# Patient Record
Sex: Female | Born: 1945 | Race: White | Hispanic: No | Marital: Married | Smoking: Never smoker
Health system: Southern US, Community
[De-identification: ages and names within clinical notes are randomized; demographics above are authoritative.]

## PROBLEM LIST (undated history)

## (undated) DIAGNOSIS — I10 Essential (primary) hypertension: Secondary | ICD-10-CM

## (undated) HISTORY — PX: ESOPHAGUS SURGERY: SHX626

## (undated) HISTORY — PX: BREAST BIOPSY: SHX20

---

## 1979-10-26 HISTORY — PX: THORACIC DISC SURGERY: SHX801

## 1993-10-25 HISTORY — PX: CHOLECYSTECTOMY: SHX55

## 2000-09-27 ENCOUNTER — Emergency Department (HOSPITAL_COMMUNITY): Admission: EM | Admit: 2000-09-27 | Discharge: 2000-09-27 | Payer: Self-pay | Admitting: Emergency Medicine

## 2001-04-06 ENCOUNTER — Other Ambulatory Visit: Admission: RE | Admit: 2001-04-06 | Discharge: 2001-04-06 | Payer: Self-pay | Admitting: Obstetrics and Gynecology

## 2002-02-08 ENCOUNTER — Ambulatory Visit (HOSPITAL_COMMUNITY): Admission: RE | Admit: 2002-02-08 | Discharge: 2002-02-08 | Payer: Self-pay | Admitting: Gastroenterology

## 2010-05-08 ENCOUNTER — Encounter: Admission: RE | Admit: 2010-05-08 | Discharge: 2010-05-08 | Payer: Self-pay | Admitting: Family Medicine

## 2011-01-20 ENCOUNTER — Encounter: Payer: Self-pay | Admitting: Family Medicine

## 2011-01-20 DIAGNOSIS — E559 Vitamin D deficiency, unspecified: Secondary | ICD-10-CM | POA: Insufficient documentation

## 2011-01-20 DIAGNOSIS — K220 Achalasia of cardia: Secondary | ICD-10-CM | POA: Insufficient documentation

## 2011-01-20 DIAGNOSIS — I10 Essential (primary) hypertension: Secondary | ICD-10-CM | POA: Insufficient documentation

## 2011-03-12 NOTE — Procedures (Signed)
[facility]  Patient:    Amber Roach, Amber Roach Visit Number: [id number] MRN: [id number]          Service Type: END Location: ENDO Attending Physician:  Nelda Marseille Dictated by:   Petra Kuba, M.D. Proc. Date: 02/08/02 Admit Date:  02/08/2002   CC:         Monica Becton, M.D.  Beather Arbour Thomasena Edis, M.D., Ph.D.   Procedure Report  PROCEDURE:  Colonoscopy.  INDICATION:  Screening with her diarrhea and urgency unchanged ever since her gallbladder, history of anemia. Consent was signed after risks, benefits, methods, options thoroughly discussed in the office on multiple occasions.  MEDICINES USED:  Demerol 50 mg, Versed 5 mg.  DESCRIPTION OF PROCEDURE:  Rectal inspection was pertinent for small external hemorrhoids. Digital exam was negative. The video pediatric adjustable colonoscope was inserted, easily advanced around the colon to the cecum. This did require some abdominal pressure but no position changes. No abnormality was seen on insertion. The cecum was identified by the appendiceal orifice and the ileocecal valve. The scope was slowly withdrawn. The prep was adequate. There was some liquid stool that required washing and suctioning. On slow withdrawal through the colon, no abnormalities were seen; specifically, no polyps, masses, diverticula or other abnormalities. Once back in the rectum, the scope was retroflexed revealing some tiny internal hemorrhoids. The scope was straightened, readvanced the shortways up to the left side of the colon. Air was suctioned and the scope removed. The patient tolerated the procedure well. There was no obvious immediate complication.  ENDOSCOPIC DIAGNOSES: 1. Internal/external tiny to small hemorrhoids. 2. Otherwise within normal limits to the cecum.  PLAN:  Happy to see back p.r.n. Repeat screening probably in five years. Return care to Drs. Christell Constant and [employer] for the customary healthcare  maintenance to include yearly rectals and guaiacs. Dictated by:   Petra Kuba, M.D. Attending Physician:  Nelda Marseille DD:  02/08/02 TD:  02/09/02 Job: [phone] [id number]

## 2011-04-08 ENCOUNTER — Other Ambulatory Visit: Payer: Self-pay | Admitting: Family Medicine

## 2011-04-08 DIAGNOSIS — Z1231 Encounter for screening mammogram for malignant neoplasm of breast: Secondary | ICD-10-CM

## 2011-05-10 ENCOUNTER — Ambulatory Visit
Admission: RE | Admit: 2011-05-10 | Discharge: 2011-05-10 | Disposition: A | Discharge status: Home / Self Care | Payer: BC Managed Care – PPO | Source: Ambulatory Visit | Attending: Family Medicine | Admitting: Family Medicine

## 2011-05-10 DIAGNOSIS — Z1231 Encounter for screening mammogram for malignant neoplasm of breast: Secondary | ICD-10-CM

## 2012-04-06 ENCOUNTER — Other Ambulatory Visit: Payer: Self-pay | Admitting: Family Medicine

## 2012-04-06 DIAGNOSIS — Z1231 Encounter for screening mammogram for malignant neoplasm of breast: Secondary | ICD-10-CM

## 2012-05-10 ENCOUNTER — Ambulatory Visit
Admission: RE | Admit: 2012-05-10 | Discharge: 2012-05-10 | Disposition: A | Discharge status: Home / Self Care | Payer: Medicare Other | Source: Ambulatory Visit | Attending: Family Medicine | Admitting: Family Medicine

## 2012-05-10 DIAGNOSIS — Z1231 Encounter for screening mammogram for malignant neoplasm of breast: Secondary | ICD-10-CM

## 2013-01-08 ENCOUNTER — Other Ambulatory Visit: Payer: Self-pay | Admitting: *Deleted

## 2013-01-08 DIAGNOSIS — Z780 Asymptomatic menopausal state: Secondary | ICD-10-CM

## 2013-02-14 ENCOUNTER — Ambulatory Visit: Payer: Self-pay | Admitting: Family Medicine

## 2013-03-04 ENCOUNTER — Other Ambulatory Visit: Payer: Self-pay | Admitting: Family Medicine

## 2013-03-08 ENCOUNTER — Other Ambulatory Visit (INDEPENDENT_AMBULATORY_CARE_PROVIDER_SITE_OTHER): Payer: Medicare Other

## 2013-03-08 DIAGNOSIS — I10 Essential (primary) hypertension: Secondary | ICD-10-CM

## 2013-03-08 DIAGNOSIS — E785 Hyperlipidemia, unspecified: Secondary | ICD-10-CM

## 2013-03-08 DIAGNOSIS — E119 Type 2 diabetes mellitus without complications: Secondary | ICD-10-CM

## 2013-03-08 DIAGNOSIS — E559 Vitamin D deficiency, unspecified: Secondary | ICD-10-CM

## 2013-03-08 LAB — COMPLETE METABOLIC PANEL WITH GFR
ALT: 27 U/L (ref 0–35)
AST: 23 U/L (ref 0–37)
Albumin: 4.5 g/dL (ref 3.5–5.2)
Alkaline Phosphatase: 54 U/L (ref 39–117)
BUN: 12 mg/dL (ref 6–23)
CO2: 26 mEq/L (ref 19–32)
Calcium: 9.9 mg/dL (ref 8.4–10.5)
Chloride: 102 mEq/L (ref 96–112)
Creat: 0.88 mg/dL (ref 0.50–1.10)
GFR, Est African American: 79 mL/min
GFR, Est Non African American: 69 mL/min
Glucose, Bld: 108 mg/dL — ABNORMAL HIGH (ref 70–99)
Potassium: 5.2 mEq/L (ref 3.5–5.3)
Sodium: 138 mEq/L (ref 135–145)
Total Bilirubin: 0.6 mg/dL (ref 0.3–1.2)
Total Protein: 7.3 g/dL (ref 6.0–8.3)

## 2013-03-08 LAB — POCT GLYCOSYLATED HEMOGLOBIN (HGB A1C): Hemoglobin A1C: 6.1

## 2013-03-08 NOTE — Progress Notes (Signed)
Patient came in for labs only.

## 2013-03-09 LAB — VITAMIN D 25 HYDROXY (VIT D DEFICIENCY, FRACTURES): Vit D, 25-Hydroxy: 28 ng/mL — ABNORMAL LOW (ref 30–89)

## 2013-03-13 ENCOUNTER — Ambulatory Visit: Payer: Self-pay | Admitting: Family Medicine

## 2013-03-14 LAB — NMR LIPOPROFILE WITH LIPIDS
Cholesterol, Total: 191 mg/dL (ref <200)
HDL Particle Number: 35.9 umol/L (ref >=30.5)
HDL Size: 8.4 nm — ABNORMAL LOW (ref >=9.2)
HDL-C: 46 mg/dL (ref >=40)
LDL (calc): 110 mg/dL — ABNORMAL HIGH (ref <100)
LDL Particle Number: 1763 nmol/L — ABNORMAL HIGH (ref <1000)
LDL Size: 21 nm (ref >20.5)
LP-IR Score: 71 — ABNORMAL HIGH (ref <=45)
Large HDL-P: <1.3 umol/L — ABNORMAL LOW (ref >=4.8)
Large VLDL-P: 3.1 nmol/L — ABNORMAL HIGH (ref <=2.7)
Small LDL Particle Number: 862 nmol/L — ABNORMAL HIGH (ref <=527)
Triglycerides: 174 mg/dL — ABNORMAL HIGH (ref <150)
VLDL Size: 48.7 nm — ABNORMAL HIGH (ref <=46.6)

## 2013-03-17 ENCOUNTER — Other Ambulatory Visit: Payer: Self-pay | Admitting: Family Medicine

## 2013-03-17 MED ORDER — FENOFIBRATE 54 MG PO TABS: 54.0000 mg | ORAL_TABLET | Freq: Every day | ORAL | Status: DC

## 2013-03-26 ENCOUNTER — Telehealth: Payer: Self-pay | Admitting: Family Medicine

## 2013-03-28 ENCOUNTER — Encounter: Payer: Self-pay | Admitting: Pharmacist

## 2013-03-28 ENCOUNTER — Ambulatory Visit (INDEPENDENT_AMBULATORY_CARE_PROVIDER_SITE_OTHER): Payer: Medicare Other | Admitting: Pharmacist

## 2013-03-28 ENCOUNTER — Ambulatory Visit (INDEPENDENT_AMBULATORY_CARE_PROVIDER_SITE_OTHER): Payer: Medicare Other

## 2013-03-28 VITALS — Ht 66.0 in | Wt 227.0 lb

## 2013-03-28 DIAGNOSIS — E559 Vitamin D deficiency, unspecified: Secondary | ICD-10-CM

## 2013-03-28 DIAGNOSIS — Z780 Asymptomatic menopausal state: Secondary | ICD-10-CM

## 2013-03-28 DIAGNOSIS — E782 Mixed hyperlipidemia: Secondary | ICD-10-CM | POA: Insufficient documentation

## 2013-03-28 NOTE — Patient Instructions (Addendum)

## 2013-03-28 NOTE — Progress Notes (Signed)
Patient ID: Amber Roach, female   DOB: 18-Oct-1946, 67 y.o.   MRN: [id number]  [facility] Current Height: Height: 5\' 6"  (167.6 cm)      Max Lifetime Height:  5\' 6"  Current Weight: Weight: 227 lb (102.967 kg)       Ethnicity:Caucasian    HPI: Does pt already have a diagnosis of:  Osteopenia?  No Osteoporosis?  No  Back Pain?  No       Kyphosis?  No Prior fracture?  No Med(s) for Osteoporosis/Osteopenia:  none Med(s) previously tried for Osteoporosis/Osteopenia:  none                                                             PMH: Age at menopause:  10's Hysterectomy?  No Oophorectomy?  No HRT? No Steroid Use?  No Thyroid med?  No History of cancer?  No History of digestive disorders (ie Crohn's)?  Yes - GERD on PPI Current or previous eating disorders?  No Last Vitamin D Result:  28 (03/08/13) Last GFR Result:  63 (03/08/2013)   FH/SH: Family history of osteoporosis?  No Parent with history of hip fracture?  No Family history of breast cancer?  No Exercise?  No  Smoking?  No Alcohol?  No    Calcium Assessment Calcium Intake  # of servings/day  Calcium mg  Milk (8 oz) 1  x  300  = 300mg   Yogurt (4 oz) 0 x  200 = 0  Cheese (1 oz) 0 x  200 = 0  Other Calcium sources   250mg   Ca supplement MVI = 400mg    Estimated calcium intake per day 950mg     DEXA Results Date of Test T-Score for AP Spine L1-L4 T-Score for Total Left Hip T-Score for Total Right Hip  03/28/2013 0.4 0.1 0.1  05/15/2008 -0.1 0.0 -0.1             Assessment: Normal BMD - BMD stable Dyslipidemia - patient taking fenofibrate 45mg  but she reports top of feet sore and thinks this may be related to fenofibrate  Recommendations: 1.  recommend calcium 1200mg  daily through supplementation and/or diet.  Recommended vitamin D 1000IU daily. 2.  recommend weight bearing exercise - 30 minutes at least 4 days   per week.   3.  Counseled and educated about fall risk and prevention. 4.   Discussed side effects of fenofibrate.  Although I don't think patient's sore feet are related to the start of fenofibrate I did advise her to hold the fenofibrate for 2 weeks and see if pain improved and then retry to see if pain returns.  She is to call office if she still believes pain is related to fenofibrate to discuss other options.    Recheck DEXA:  2 years  Time spent counseling patient:  20 minutes

## 2013-03-28 NOTE — Telephone Encounter (Signed)
Pt called back and labs were discussed with her

## 2013-04-11 ENCOUNTER — Encounter: Payer: Self-pay | Admitting: Family Medicine

## 2013-04-11 ENCOUNTER — Ambulatory Visit (INDEPENDENT_AMBULATORY_CARE_PROVIDER_SITE_OTHER): Payer: Medicare Other | Admitting: Family Medicine

## 2013-04-11 VITALS — BP 125/72 | HR 80 | Temp 98.2°F | Wt 221.8 lb

## 2013-04-11 DIAGNOSIS — E8881 Metabolic syndrome: Secondary | ICD-10-CM

## 2013-04-11 DIAGNOSIS — E782 Mixed hyperlipidemia: Secondary | ICD-10-CM

## 2013-04-11 DIAGNOSIS — E669 Obesity, unspecified: Secondary | ICD-10-CM | POA: Insufficient documentation

## 2013-04-11 DIAGNOSIS — E559 Vitamin D deficiency, unspecified: Secondary | ICD-10-CM

## 2013-04-11 DIAGNOSIS — I10 Essential (primary) hypertension: Secondary | ICD-10-CM

## 2013-04-11 DIAGNOSIS — K220 Achalasia of cardia: Secondary | ICD-10-CM

## 2013-04-11 DIAGNOSIS — K219 Gastro-esophageal reflux disease without esophagitis: Secondary | ICD-10-CM | POA: Insufficient documentation

## 2013-04-11 MED ORDER — LISINOPRIL-HYDROCHLOROTHIAZIDE 20-12.5 MG PO TABS: ORAL_TABLET | ORAL | Status: DC

## 2013-04-11 MED ORDER — PANTOPRAZOLE SODIUM 40 MG PO TBEC: DELAYED_RELEASE_TABLET | ORAL | Status: AC

## 2013-04-11 MED ORDER — FENOFIBRATE 120 MG PO TABS
120.0000 mg | ORAL_TABLET | Freq: Every day | ORAL | Status: DC
Start: 2013-04-11 — End: 2013-04-16

## 2013-04-11 NOTE — Progress Notes (Signed)
Patient ID: Amber Roach, female   DOB: Jan 02, 1946, 67 y.o.   MRN: [id number] SUBJECTIVE: CC: Chief Complaint  Patient presents with  . Medication Refill    all meds  needs refilling    HPI: 1. Patient is here for follow up of hypertension:  denies Headache;deniesChest Pain;denies weakness;denies Shortness of Breath or Orthopnea;denies Visual changes;denies palpitations;denies cough;denies pedal edema;denies symptoms of TIA or stroke; admits to Compliance with medications. denies Problems with medications.  2. Patient is here for follow up of hyperlipidemia:  denies Headache;denies Chest Pain;denies weakness;denies Shortness of Breath and orthopnea;denies Visual changes;denies palpitations;denies cough;denies pedal edema;denies symptoms of TIA or stroke;deniesClaudication symptoms. admits to Compliance with medications; denies Problems with medications.  Breakfast: oatmeal,instant 1 pack. Lunch: salads, tuna salad Dinner: chicken breast, green beans/pintos, occasional potato or bread, occasional hamburger  3. Vitamin D deficiency   PMH/PSH: reviewed/updated in Epic  SH/FH: reviewed/updated in Epic  Allergies: reviewed/updated in Epic  Medications: reviewed/updated in Epic  Immunizations: reviewed/updated in Epic  ROS: As above in the HPI. All other systems are stable or negative.  OBJECTIVE: APPEARANCE:  Patient in no acute distress.The patient appeared well nourished and normally developed. Acyanotic. Waist: 45 inches VITAL SIGNS:BP 125/72  Pulse 80  Temp(Src) 98.2 F (36.8 C) (Oral)  Wt 221 lb 12.8 oz (100.608 kg)  BMI 35.82 kg/m2 Female  SKIN: warm and  Dry without overt rashes, tattoos and scars  HEAD and Neck: without JVD, Head and scalp: normal Eyes:No scleral icterus. Fundi normal, eye movements normal. Ears: Auricle normal, canal normal, Tympanic membranes normal, insufflation normal. Nose: normal Throat: normal Neck & thyroid: normal  CHEST &  LUNGS: Chest wall: normal Lungs: Clear  CVS: Reveals the PMI to be normally located. Regular rhythm, First and Second Heart sounds are normal,  absence of murmurs, rubs or gallops. Peripheral vasculature: Radial pulses: normal Dorsal pedis pulses: normal Posterior pulses: normal  ABDOMEN:  Appearance:obese Benign, no organomegaly, no masses, no Abdominal Aortic enlargement. No Guarding , no rebound. No Bruits. Bowel sounds: normal  RECTAL: N/A GU: N/A  EXTREMETIES: nonedematous. Both Femoral and Pedal pulses are normal.  MUSCULOSKELETAL:  Spine: normal Joints: intact  NEUROLOGIC: oriented to time,place and person; nonfocal. Strength is normal Sensory is normal Reflexes are normal Cranial Nerves are normal.  Results for orders placed in visit on 03/08/13  COMPLETE METABOLIC PANEL WITH GFR      Result Value Range   Sodium 138  135 - 145 mEq/L   Potassium 5.2  3.5 - 5.3 mEq/L   Chloride 102  96 - 112 mEq/L   CO2 26  19 - 32 mEq/L   Glucose, Bld 108 (*) 70 - 99 mg/dL   BUN 12  6 - 23 mg/dL   Creat 4.09  8.11 - 9.14 mg/dL   Total Bilirubin 0.6  0.3 - 1.2 mg/dL   Alkaline Phosphatase 54  39 - 117 U/L   AST 23  0 - 37 U/L   ALT 27  0 - 35 U/L   Total Protein 7.3  6.0 - 8.3 g/dL   Albumin 4.5  3.5 - 5.2 g/dL   Calcium 9.9  8.4 - 78.2 mg/dL   GFR, Est African American 79     GFR, Est Non African American 69    VITAMIN D 25 HYDROXY      Result Value Range   Vit D, 25-Hydroxy 28 (*) 30 - 89 ng/mL  NMR LIPOPROFILE WITH LIPIDS      Result Value  Range   LDL Particle Number [id number] (*) <1000 nmol/L   LDL (calc) 110 (*) <100 mg/dL   HDL-C 46  >=16 mg/dL   Triglycerides 109 (*) <150 mg/dL   Cholesterol, Total 604  <200 mg/dL   HDL Particle Number [id number]  >=98.1 umol/L   Large HDL-P <1.3 (*) >=4.8 umol/L   Large VLDL-P 3.1 (*) <=2.7 nmol/L   Small LDL Particle Number [id number] (*) <=527 nmol/L   LDL Size 21.0  >20.5 nm   HDL Size 8.4 (*) >=9.2 nm   VLDL Size 48.7 (*) <=46.6  nm   LP-IR Score 71 (*) <=45  POCT GLYCOSYLATED HEMOGLOBIN (HGB A1C)      Result Value Range   Hemoglobin A1C 6.1 %      ASSESSMENT: Vitamin D deficiency - Plan: cholecalciferol (VITAMIN D) 1000 UNITS tablet  Mixed dyslipidemia - Plan: fenofibrate 120 MG TABS  Hypertension - Plan: lisinopril-hydrochlorothiazide (PRINZIDE,ZESTORETIC) 20-12.5 MG per tablet  Achalasia - Plan: pantoprazole (PROTONIX) 40 MG tablet  GERD (gastroesophageal reflux disease) - Plan: pantoprazole (PROTONIX) 40 MG tablet  Obesity, unspecified  Metabolic syndrome  PLAN:      Dr Woodroe Mode Recommendations  Diet and Exercise discussed with patient.  For nutrition information, I recommend books:  1).Eat to Live by Dr Monico Hoar. 2).Prevent and Reverse Heart Disease by Dr Suzzette Righter. 3) Dr Katherina Right Book:  Program to Reverse Diabetes  Exercise recommendations are:  If unable to walk, then the patient can exercise in a chair 3 times a day. By flapping arms like a bird gently and raising legs outwards to the front.  If ambulatory, the patient can go for walks for 30 minutes 3 times a week. Then increase the intensity and duration as tolerated.  Goal is to try to attain exercise frequency to 5 times a week.  If applicable: Best to perform resistance exercises (machines or weights) 2 days a week and cardio type exercises 3 days per week.  Reviewed labs with patient.  Meds ordered this encounter  Medications  . lisinopril-hydrochlorothiazide (PRINZIDE,ZESTORETIC) 20-12.5 MG per tablet    Sig: TAKE ONE TABLET BY MOUTH ONE TIME DAILY    Dispense:  30 tablet    Refill:  11  . pantoprazole (PROTONIX) 40 MG tablet    Sig: TAKE  ONE TABLET BY MOUTH EVERY MORNING    Dispense:  30 tablet    Refill:  11  . fenofibrate 120 MG TABS    Sig: Take 1 tablet (120 mg total) by mouth daily.    Dispense:  30 tablet    Refill:  11  . cholecalciferol (VITAMIN D) 1000 UNITS tablet    Sig: Take 1  tablet (1,000 Units total) by mouth daily.   Return in about 4 months (around 08/11/2013) for Recheck medical problems.   Marlan Steward P. Modesto Charon, M.D.

## 2013-04-11 NOTE — Patient Instructions (Addendum)
      Dr Jazell Rosenau's Recommendations  Diet and Exercise discussed with patient.  For nutrition information, I recommend books:  1).Eat to Live by Dr Joel Fuhrman. 2).Prevent and Reverse Heart Disease by Dr Caldwell Esselstyn. 3) Dr Neal Barnard's Book:  Program to Reverse Diabetes  Exercise recommendations are:  If unable to walk, then the patient can exercise in a chair 3 times a day. By flapping arms like a bird gently and raising legs outwards to the front.  If ambulatory, the patient can go for walks for 30 minutes 3 times a week. Then increase the intensity and duration as tolerated.  Goal is to try to attain exercise frequency to 5 times a week.  If applicable: Best to perform resistance exercises (machines or weights) 2 days a week and cardio type exercises 3 days per week.  

## 2013-04-16 ENCOUNTER — Other Ambulatory Visit: Payer: Self-pay

## 2013-04-16 ENCOUNTER — Other Ambulatory Visit: Payer: Self-pay | Admitting: *Deleted

## 2013-04-16 DIAGNOSIS — Z1231 Encounter for screening mammogram for malignant neoplasm of breast: Secondary | ICD-10-CM

## 2013-04-16 MED ORDER — FENOFIBRATE 54 MG PO TABS: 54.0000 mg | ORAL_TABLET | Freq: Every day | ORAL | Status: DC

## 2013-05-16 ENCOUNTER — Ambulatory Visit
Admission: RE | Admit: 2013-05-16 | Discharge: 2013-05-16 | Disposition: A | Discharge status: Home / Self Care | Payer: Medicare Other | Source: Ambulatory Visit

## 2013-05-16 DIAGNOSIS — Z1231 Encounter for screening mammogram for malignant neoplasm of breast: Secondary | ICD-10-CM

## 2013-05-28 ENCOUNTER — Encounter: Payer: Self-pay | Admitting: *Deleted

## 2013-07-09 ENCOUNTER — Encounter: Payer: Self-pay | Admitting: *Deleted

## 2013-07-31 ENCOUNTER — Other Ambulatory Visit: Payer: Medicare Other

## 2013-08-07 ENCOUNTER — Ambulatory Visit: Payer: Medicare Other | Admitting: Family Medicine

## 2013-08-07 ENCOUNTER — Other Ambulatory Visit: Payer: Medicare Other

## 2013-08-08 ENCOUNTER — Ambulatory Visit: Payer: Medicare Other | Admitting: Family Medicine

## 2013-08-14 ENCOUNTER — Ambulatory Visit: Payer: Medicare Other | Admitting: Family Medicine

## 2014-04-15 ENCOUNTER — Other Ambulatory Visit: Payer: Self-pay

## 2014-04-15 DIAGNOSIS — Z1231 Encounter for screening mammogram for malignant neoplasm of breast: Secondary | ICD-10-CM

## 2014-05-02 ENCOUNTER — Other Ambulatory Visit: Payer: Self-pay

## 2014-05-17 ENCOUNTER — Encounter (INDEPENDENT_AMBULATORY_CARE_PROVIDER_SITE_OTHER): Payer: Self-pay

## 2014-05-17 ENCOUNTER — Ambulatory Visit
Admission: RE | Admit: 2014-05-17 | Discharge: 2014-05-17 | Disposition: A | Discharge status: Home / Self Care | Payer: Medicare HMO | Source: Ambulatory Visit

## 2014-05-17 DIAGNOSIS — Z1231 Encounter for screening mammogram for malignant neoplasm of breast: Secondary | ICD-10-CM

## 2015-06-06 ENCOUNTER — Other Ambulatory Visit: Payer: Self-pay

## 2015-06-06 DIAGNOSIS — Z1231 Encounter for screening mammogram for malignant neoplasm of breast: Secondary | ICD-10-CM

## 2015-07-17 ENCOUNTER — Ambulatory Visit
Admission: RE | Admit: 2015-07-17 | Discharge: 2015-07-17 | Disposition: A | Discharge status: Home / Self Care | Payer: Medicare HMO | Source: Ambulatory Visit

## 2015-07-17 DIAGNOSIS — Z1231 Encounter for screening mammogram for malignant neoplasm of breast: Secondary | ICD-10-CM

## 2016-03-05 ENCOUNTER — Encounter (INDEPENDENT_AMBULATORY_CARE_PROVIDER_SITE_OTHER): Payer: Self-pay

## 2016-06-21 ENCOUNTER — Other Ambulatory Visit: Payer: Self-pay | Admitting: *Deleted

## 2016-06-21 DIAGNOSIS — Z1231 Encounter for screening mammogram for malignant neoplasm of breast: Secondary | ICD-10-CM

## 2016-07-19 ENCOUNTER — Ambulatory Visit
Admission: RE | Admit: 2016-07-19 | Discharge: 2016-07-19 | Disposition: A | Discharge status: Home / Self Care | Payer: Medicare HMO | Source: Ambulatory Visit | Attending: *Deleted | Admitting: *Deleted

## 2016-07-19 DIAGNOSIS — Z1231 Encounter for screening mammogram for malignant neoplasm of breast: Secondary | ICD-10-CM

## 2017-06-28 ENCOUNTER — Other Ambulatory Visit: Payer: Self-pay | Admitting: *Deleted

## 2017-06-28 DIAGNOSIS — Z1231 Encounter for screening mammogram for malignant neoplasm of breast: Secondary | ICD-10-CM

## 2017-07-21 ENCOUNTER — Ambulatory Visit
Admission: RE | Admit: 2017-07-21 | Discharge: 2017-07-21 | Disposition: A | Discharge status: Home / Self Care | Payer: Medicare HMO | Source: Ambulatory Visit | Attending: *Deleted | Admitting: *Deleted

## 2017-07-21 DIAGNOSIS — Z1231 Encounter for screening mammogram for malignant neoplasm of breast: Secondary | ICD-10-CM

## 2018-07-02 ENCOUNTER — Emergency Department (HOSPITAL_COMMUNITY): Payer: Medicare HMO

## 2018-07-02 ENCOUNTER — Encounter (HOSPITAL_COMMUNITY): Payer: Self-pay | Admitting: Emergency Medicine

## 2018-07-02 ENCOUNTER — Other Ambulatory Visit: Payer: Self-pay

## 2018-07-02 ENCOUNTER — Emergency Department (HOSPITAL_COMMUNITY)
Admission: EM | Admit: 2018-07-02 | Discharge: 2018-07-03 | Disposition: A | Discharge status: Home / Self Care | Payer: Medicare HMO | Attending: Emergency Medicine | Admitting: Emergency Medicine

## 2018-07-02 DIAGNOSIS — I10 Essential (primary) hypertension: Secondary | ICD-10-CM | POA: Insufficient documentation

## 2018-07-02 DIAGNOSIS — Z79899 Other long term (current) drug therapy: Secondary | ICD-10-CM | POA: Diagnosis not present

## 2018-07-02 DIAGNOSIS — R1013 Epigastric pain: Secondary | ICD-10-CM | POA: Diagnosis not present

## 2018-07-02 DIAGNOSIS — R109 Unspecified abdominal pain: Secondary | ICD-10-CM | POA: Diagnosis not present

## 2018-07-02 HISTORY — DX: Essential (primary) hypertension: I10

## 2018-07-02 LAB — COMPREHENSIVE METABOLIC PANEL
ALBUMIN: 3.9 g/dL (ref 3.5–5.0)
ALK PHOS: 60 U/L (ref 38–126)
ALT: 26 U/L (ref 0–44)
ANION GAP: 8 (ref 5–15)
AST: 20 U/L (ref 15–41)
BUN: 9 mg/dL (ref 8–23)
CHLORIDE: 99 mmol/L (ref 98–111)
CO2: 26 mmol/L (ref 22–32)
Calcium: 9.1 mg/dL (ref 8.9–10.3)
Creatinine, Ser: 0.57 mg/dL (ref 0.44–1.00)
GFR calc non Af Amer: >60 mL/min (ref >60)
Glucose, Bld: 108 mg/dL — ABNORMAL HIGH (ref 70–99)
POTASSIUM: 3.6 mmol/L (ref 3.5–5.1)
SODIUM: 133 mmol/L — AB (ref 135–145)
TOTAL PROTEIN: 7.5 g/dL (ref 6.5–8.1)
Total Bilirubin: 0.4 mg/dL (ref 0.3–1.2)

## 2018-07-02 LAB — CBC WITH DIFFERENTIAL/PLATELET
BASOS ABS: 0 10*3/uL (ref 0.0–0.1)
BASOS PCT: 1 %
Eosinophils Absolute: 0.1 10*3/uL (ref 0.0–0.7)
Eosinophils Relative: 2 %
HCT: 37 % (ref 36.0–46.0)
HEMOGLOBIN: 12.5 g/dL (ref 12.0–15.0)
LYMPHS ABS: 2.1 10*3/uL (ref 0.7–4.0)
Lymphocytes Relative: 32 %
MCH: 29.8 pg (ref 26.0–34.0)
MCHC: 33.8 g/dL (ref 30.0–36.0)
MCV: 88.1 fL (ref 78.0–100.0)
Monocytes Absolute: 0.6 10*3/uL (ref 0.1–1.0)
Monocytes Relative: 9 %
NEUTROS PCT: 56 %
Neutro Abs: 3.8 10*3/uL (ref 1.7–7.7)
Platelets: 280 10*3/uL (ref 150–400)
RBC: 4.2 MIL/uL (ref 3.87–5.11)
RDW: 14.1 % (ref 11.5–15.5)
WBC: 6.7 10*3/uL (ref 4.0–10.5)

## 2018-07-02 MED ORDER — SODIUM CHLORIDE 0.9 % IV BOLUS
500.0000 mL | Freq: Once | INTRAVENOUS | Status: AC
  Administered 2018-07-02: 500 mL via INTRAVENOUS

## 2018-07-02 MED ORDER — IOPAMIDOL (ISOVUE-300) INJECTION 61%
100.0000 mL | Freq: Once | INTRAVENOUS | Status: AC | PRN
  Administered 2018-07-02: 100 mL via INTRAVENOUS

## 2018-07-02 NOTE — ED Provider Notes (Signed)
[facility] EMERGENCY DEPARTMENT Provider Note   CSN: [id number] Arrival date & time: 07/02/18  2151     History   Chief Complaint Chief Complaint  Patient presents with  . Abdominal Pain    HPI Keierra Nudo is a 72 y.o. female.  HPI Patient presents with right flank pain.  Began 3 days ago.  Began in the front and now works more to the back.  Worse with certain movements.  Feels better being pressed on.  Pain is been more constant.  No nausea vomiting or diarrhea but has had decreased appetite.  No dysuria.  No fevers.  Seen at urgent care and had negative urinalysis and reportedly negative x-ray.  Has not had pains like this before.  States she never had chickenpox as a child but is not sure because no other family members around.  Pain has dull. Past Medical History:  Diagnosis Date  . Hypertension     Patient Active Problem List   Diagnosis Date Noted  . GERD (gastroesophageal reflux disease) 04/11/2013  . Obesity, unspecified 04/11/2013  . Metabolic syndrome 46/96/2952  . Mixed dyslipidemia 03/28/2013  . Achalasia 01/20/2011  . Hypertension 01/20/2011  . Vitamin D deficiency 01/20/2011    Past Surgical History:  Procedure Laterality Date  . BREAST BIOPSY    . CHOLECYSTECTOMY  1995  . THORACIC DISC SURGERY  1981     OB History   None      Home Medications    Prior to Admission medications   Medication Sig Start Date End Date Taking? Authorizing Provider  calcium-vitamin D 250-100 MG-UNIT per tablet Take 1 tablet by mouth daily.      [provider]  cholecalciferol (VITAMIN D) 1000 UNITS tablet Take 1 tablet (1,000 Units total) by mouth daily. 04/11/13   Vernie Shanks, MD  fenofibrate 54 MG tablet Take 1 tablet (54 mg total) by mouth daily. 04/16/13   Vernie Shanks, MD  lisinopril-hydrochlorothiazide (PRINZIDE,ZESTORETIC) 20-12.5 MG per tablet TAKE ONE TABLET BY MOUTH ONE TIME DAILY 04/11/13   Vernie Shanks, MD  Omega-3 Fatty Acids (FISH OIL)  1000 MG CAPS Take 2 capsules by mouth 2 (two) times daily.      [provider]  pantoprazole (PROTONIX) 40 MG tablet TAKE  ONE TABLET BY MOUTH EVERY MORNING 04/11/13   Vernie Shanks, MD    Family History Family History  Problem Relation Age of Onset  . Stroke Mother 79  . Stroke Father 32  . Hearing loss Brother     Social History Social History   Tobacco Use  . Smoking status: Never Smoker  . Smokeless tobacco: Never Used  Substance Use Topics  . Alcohol use: Never    Frequency: Never  . Drug use: Never     Allergies   Aspirin; Codeine; Zocor [simvastatin]; and Nsaids   Review of Systems Review of Systems  Constitutional: Positive for appetite change.  HENT: Negative for congestion.   Respiratory: Negative for shortness of breath.   Cardiovascular: Negative for chest pain.  Gastrointestinal: Positive for abdominal pain.  Genitourinary: Positive for flank pain.  Musculoskeletal: Negative for back pain.  Neurological: Negative for weakness.  Hematological: Negative for adenopathy.  Psychiatric/Behavioral: Negative for confusion.     Physical Exam Updated Vital Signs BP (!) 170/72 (BP Location: Right Arm)   Pulse 75   Temp 97.7 F (36.5 C) (Oral)   Resp 20   Ht 5\' 6"  (1.676 m)   Wt 92.1 kg  SpO2 100%   BMI 32.77 kg/m   Physical Exam  Constitutional: She appears well-developed.  HENT:  Head: Atraumatic.  Eyes: Pupils are equal, round, and reactive to light.  Cardiovascular: Regular rhythm.  Pulmonary/Chest: Breath sounds normal.  Abdominal: Normal appearance. No hernia.  Some right flank tenderness.  Minimal abdominal tenderness in the upper abdomen.  Neurological: She is alert.  Skin: Skin is warm. Capillary refill takes less than 2 seconds.     ED Treatments / Results  Labs (all labs ordered are listed, but only abnormal results are displayed) Labs Reviewed  COMPREHENSIVE METABOLIC PANEL - Abnormal; Notable for the following  components:      Result Value   Sodium 133 (*)    Glucose, Bld 108 (*)    All other components within normal limits  CBC WITH DIFFERENTIAL/PLATELET    EKG None  Radiology No results found.  Procedures Procedures (including critical care time)  Medications Ordered in ED Medications  sodium chloride 0.9 % bolus 500 mL (500 mLs Intravenous New Bag/Given 07/02/18 2235)  iopamidol (ISOVUE-300) 61 % injection 100 mL (100 mLs Intravenous Contrast Given 07/02/18 2335)     Initial Impression / Assessment and Plan / ED Course  I have reviewed the triage vital signs and the nursing notes.  Pertinent labs & imaging results that were available during my care of the patient were reviewed by me and considered in my medical decision making (see chart for details).     Patient with right flank and abdominal pain.  Urinalysis done at outside urgent care results reviewed.  It was reassuring with no blood or infection.  However continued pain.  Lab work reassuring but will get CT scan.  Care will be turned over to Dr. Roxanne Mins.  Zoster also considered due to the distribution but does not have a rash yet.  Also it is unclear if she had chickenpox as a child.  Final Clinical Impressions(s) / ED Diagnoses   Final diagnoses:  None    ED Discharge Orders    None       Davonna Belling, MD 07/02/18 2337

## 2018-07-02 NOTE — ED Triage Notes (Signed)
Pt with RLQ abdominal pain that radiates to back. Pt seen at urgent care this morning and had xrays done to check for kidney stones and they were negative but pt states pain is "just getting worse".

## 2018-07-02 NOTE — ED Provider Notes (Signed)
11:44 PM Care assumed from Dr. Alvino Chapel, patient with abdominal pain, possible shingles prior to development of rash.  CT of abdomen and pelvis is pending.  12:50 AM CT is unremarkable.  Patient's pain is associated with skin sensitivity which is strongly suggestive of herpes zoster.  No rash present.  I have explained the possibility of herpes zoster to the patient and advised to return or see her physician immediately should she develop a rash where her pain is located.  She is discharged with prescription for oxycodone-acetaminophen.  Results for orders placed or performed during the hospital encounter of 07/02/18  CBC with Differential  Result Value Ref Range   WBC 6.7 4.0 - 10.5 K/uL   RBC 4.20 3.87 - 5.11 MIL/uL   Hemoglobin 12.5 12.0 - 15.0 g/dL   HCT 37.0 36.0 - 46.0 %   MCV 88.1 78.0 - 100.0 fL   MCH 29.8 26.0 - 34.0 pg   MCHC 33.8 30.0 - 36.0 g/dL   RDW 14.1 11.5 - 15.5 %   Platelets 280 150 - 400 K/uL   Neutrophils Relative % 56 %   Neutro Abs 3.8 1.7 - 7.7 K/uL   Lymphocytes Relative 32 %   Lymphs Abs 2.1 0.7 - 4.0 K/uL   Monocytes Relative 9 %   Monocytes Absolute 0.6 0.1 - 1.0 K/uL   Eosinophils Relative 2 %   Eosinophils Absolute 0.1 0.0 - 0.7 K/uL   Basophils Relative 1 %   Basophils Absolute 0.0 0.0 - 0.1 K/uL  Comprehensive metabolic panel  Result Value Ref Range   Sodium 133 (L) 135 - 145 mmol/L   Potassium 3.6 3.5 - 5.1 mmol/L   Chloride 99 98 - 111 mmol/L   CO2 26 22 - 32 mmol/L   Glucose, Bld 108 (H) 70 - 99 mg/dL   BUN 9 8 - 23 mg/dL   Creatinine, Ser 0.57 0.44 - 1.00 mg/dL   Calcium 9.1 8.9 - 10.3 mg/dL   Total Protein 7.5 6.5 - 8.1 g/dL   Albumin 3.9 3.5 - 5.0 g/dL   AST 20 15 - 41 U/L   ALT 26 0 - 44 U/L   Alkaline Phosphatase 60 38 - 126 U/L   Total Bilirubin 0.4 0.3 - 1.2 mg/dL   GFR calc non Af Amer >60 >60 mL/min   GFR calc Af Amer >60 >60 mL/min   Anion gap 8 5 - 15   Ct Abdomen Pelvis W Contrast  Result Date: 07/03/2018 CLINICAL DATA:   Abdominal pain EXAM: CT ABDOMEN AND PELVIS WITH CONTRAST TECHNIQUE: Multidetector CT imaging of the abdomen and pelvis was performed using the standard protocol following bolus administration of intravenous contrast. CONTRAST:  156mL ISOVUE-300 IOPAMIDOL (ISOVUE-300) INJECTION 61% COMPARISON:  None. FINDINGS: LOWER CHEST: There is no basilar pleural or apical pericardial effusion. HEPATOBILIARY: The hepatic contours and density are normal. There is no intra- or extrahepatic biliary dilatation. Status post cholecystectomy. PANCREAS: The pancreatic parenchymal contours are normal and there is no ductal dilatation. There is no peripancreatic fluid collection. SPLEEN: Normal. ADRENALS/URINARY TRACT: --Adrenal glands: Normal. --Right kidney/ureter: No hydronephrosis, nephroureterolithiasis, perinephric stranding or solid renal mass. --Left kidney/ureter: No hydronephrosis, nephroureterolithiasis, perinephric stranding or solid renal mass. --Urinary bladder: Normal for degree of distention STOMACH/BOWEL: --Stomach/Duodenum: There is no hiatal hernia or other gastric abnormality. The duodenal course and caliber are normal. --Small bowel: No dilatation or inflammation. --Colon: No focal abnormality. --Appendix: Normal. VASCULAR/LYMPHATIC: Atherosclerotic calcification is present within the non-aneurysmal abdominal aorta, without hemodynamically significant  stenosis. The portal vein, splenic vein, superior mesenteric vein and IVC are patent. No abdominal or pelvic lymphadenopathy. REPRODUCTIVE: Normal uterus and ovaries. MUSCULOSKELETAL. Multilevel degenerative disc disease and facet arthrosis. No bony spinal canal stenosis. OTHER: None. IMPRESSION: 1. No acute abdominal or pelvic abnormality. 2.  Aortic Atherosclerosis (ICD10-I70.0). Electronically Signed   By: Ulyses Jarred M.D.   On: 07/03/2018 00:21   Images viewed by me.'   Delora Fuel, MD 48/62/82 [phone]

## 2018-07-03 MED ORDER — OXYCODONE-ACETAMINOPHEN 5-325 MG PO TABS
1.0000 | ORAL_TABLET | Freq: Once | ORAL | Status: AC
  Administered 2018-07-03: 1 via ORAL
  Filled 2018-07-03: qty 1

## 2018-07-03 MED ORDER — OXYCODONE-ACETAMINOPHEN 5-325 MG PO TABS: 1.0000 | ORAL_TABLET | ORAL | 0 refills | Status: DC | PRN

## 2018-07-03 NOTE — Discharge Instructions (Signed)
Your blood work and CT scan did not show a cause for your pain.  There is concerned that this might be shingles.  If you break out in a rash in the area where you are having pain, see a physician as soon as possible so that she can get started on medication which is very specific for shingles.

## 2018-07-12 ENCOUNTER — Other Ambulatory Visit: Payer: Self-pay | Admitting: *Deleted

## 2018-07-12 DIAGNOSIS — Z1231 Encounter for screening mammogram for malignant neoplasm of breast: Secondary | ICD-10-CM

## 2018-08-16 ENCOUNTER — Ambulatory Visit
Admission: RE | Admit: 2018-08-16 | Discharge: 2018-08-16 | Disposition: A | Discharge status: Home / Self Care | Payer: Medicare HMO | Source: Ambulatory Visit | Attending: *Deleted | Admitting: *Deleted

## 2018-08-16 DIAGNOSIS — Z1231 Encounter for screening mammogram for malignant neoplasm of breast: Secondary | ICD-10-CM

## 2019-08-14 IMAGING — CT CT ABD-PELV W/ CM
2 of 4 series · 17 of 46 positions shown, 19 images · IV contrast (Isovue)
Comparison: None.

CLINICAL DATA: Abdominal pain

EXAM:
CT ABDOMEN AND PELVIS WITH CONTRAST
TECHNIQUE: Multidetector CT imaging of the abdomen and pelvis was performed
using the standard protocol following bolus administration of
intravenous contrast.
CONTRAST:  100mL VNLXFG-Z77 IOPAMIDOL (VNLXFG-Z77) INJECTION 61%

[Series 2: axial st · axial · 0.83mm/px · z∈[+726,+1126]mm · 14 of 89 slices shown, 16 images]
[im 5/89  soft-tissue]
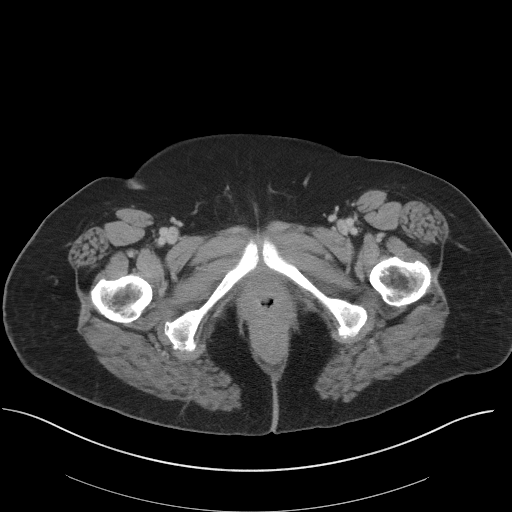
[im 5/89  bone]
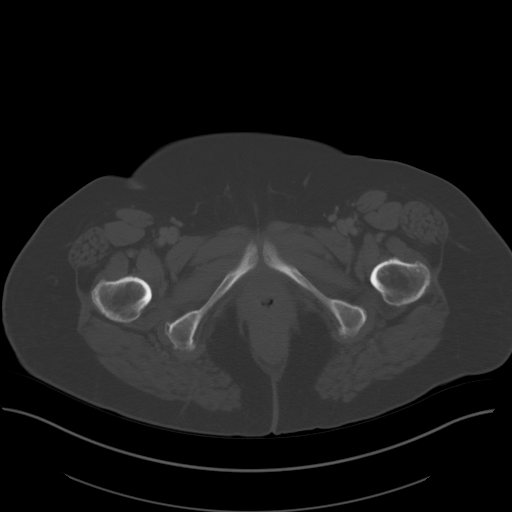
[im 13/89  soft-tissue]
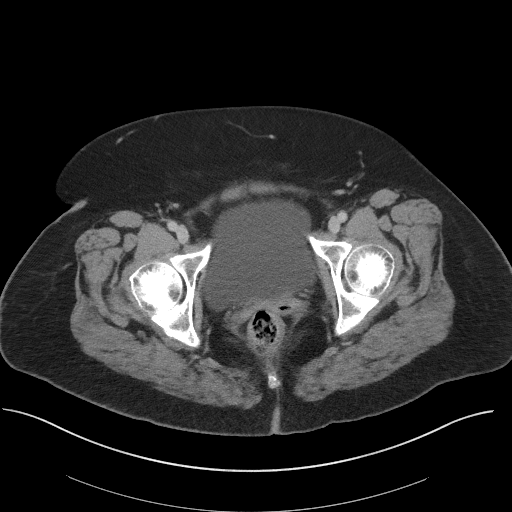
[im 17/89  soft-tissue]
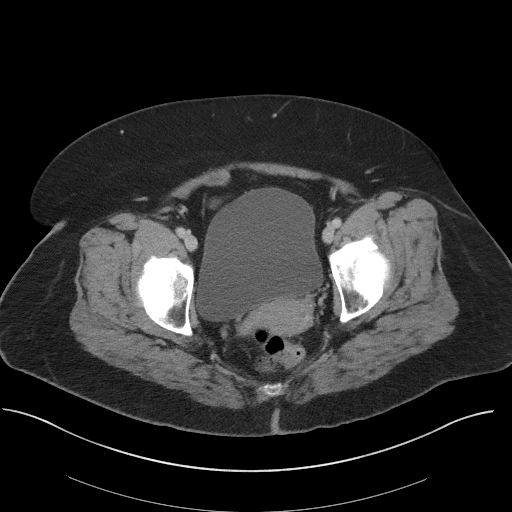
[im 25/89  soft-tissue]
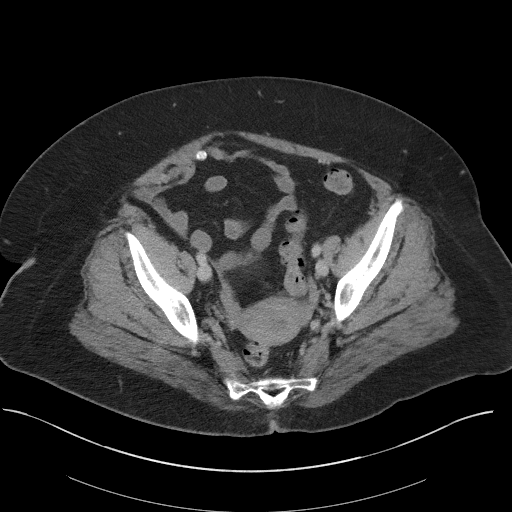
[im 29/89  soft-tissue]
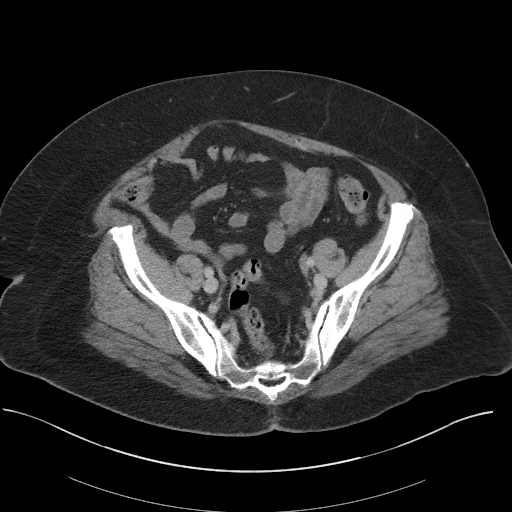
[im 37/89  soft-tissue]
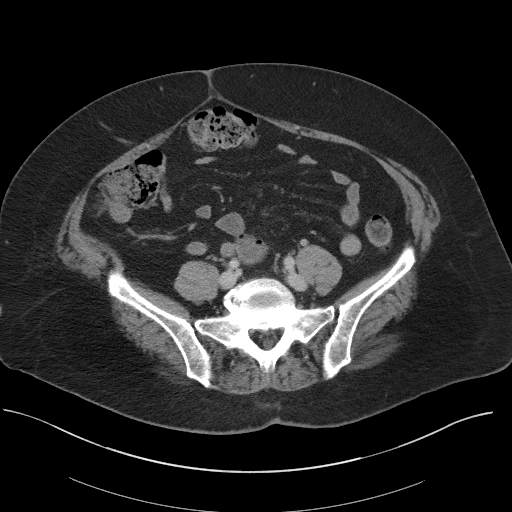
[im 41/89  soft-tissue]
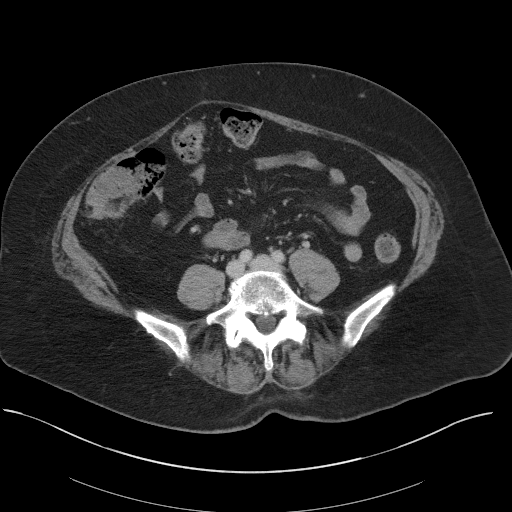
[im 49/89  soft-tissue]
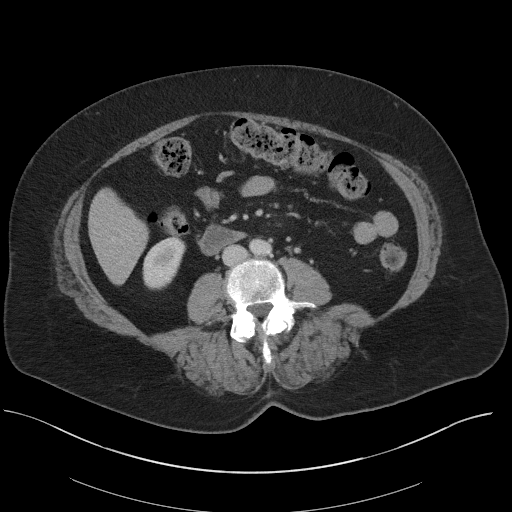
[im 53/89  soft-tissue]
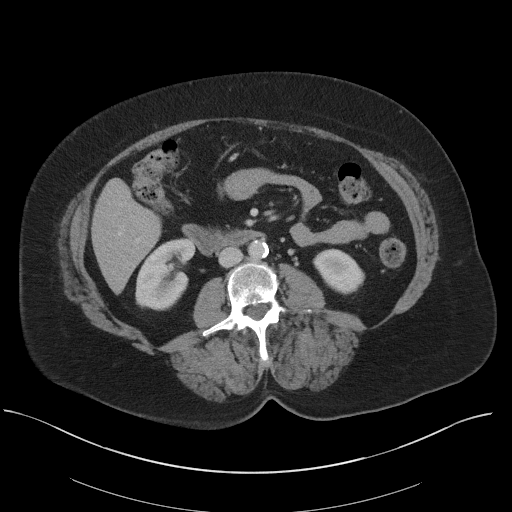
[im 53/89  bone]
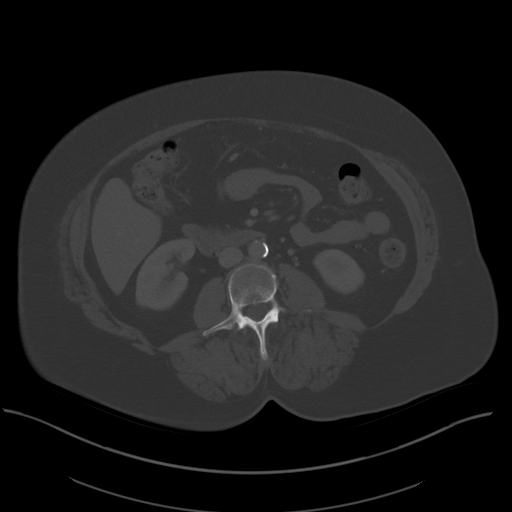
[im 61/89  soft-tissue]
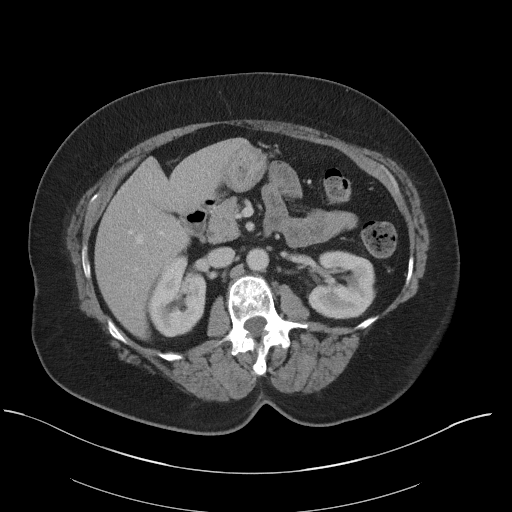
[im 65/89  soft-tissue]
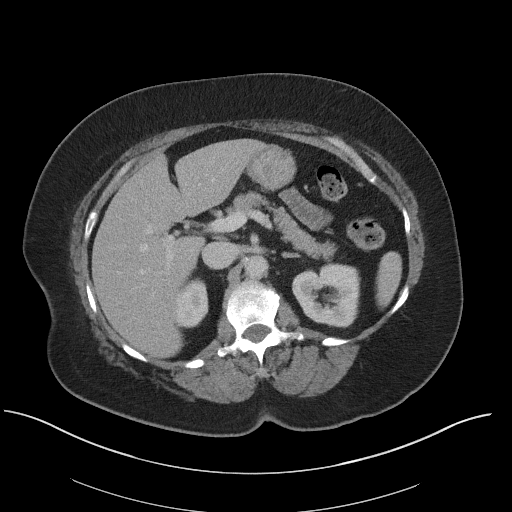
[im 73/89  soft-tissue]
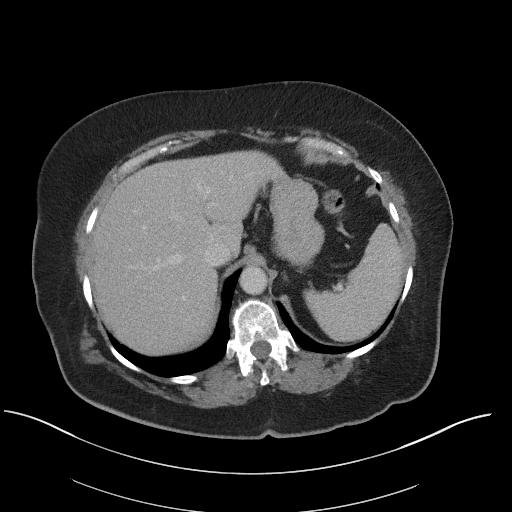
[im 77/89  soft-tissue]
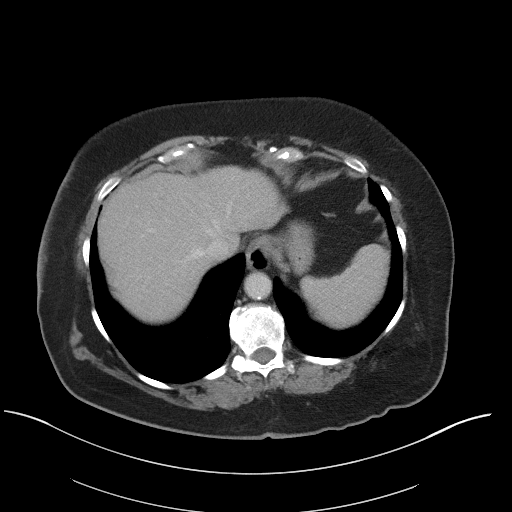
[im 85/89  soft-tissue]
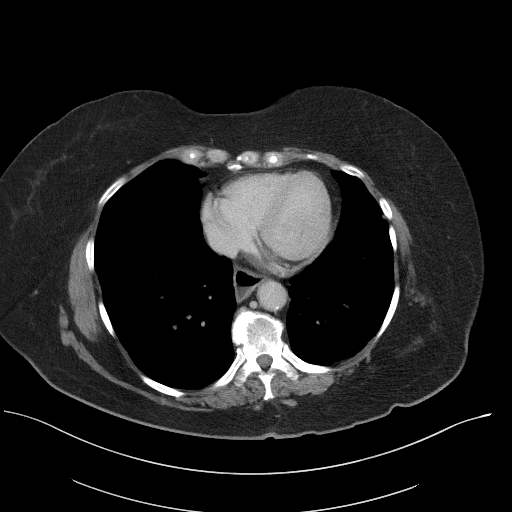

[Series 5: coronal st · coronal · 0.88mm/px · 3 of 117 slices shown]
[im 39/117  soft-tissue]
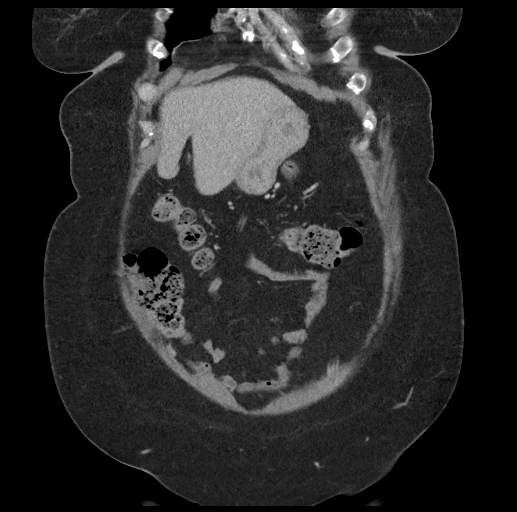
[im 52/117  soft-tissue]
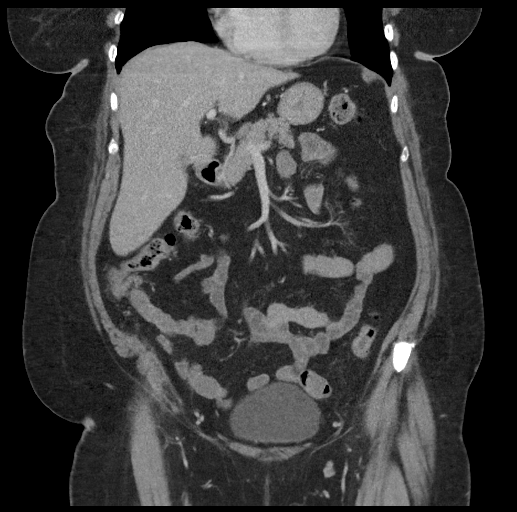
[im 65/117  soft-tissue]
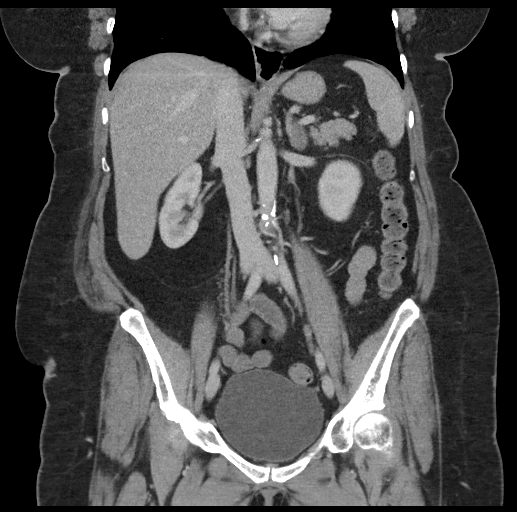

[17 of 46 positions shown; findings below may reference images not displayed]

FINDINGS: LOWER CHEST: There is no basilar pleural or apical pericardial
effusion.

HEPATOBILIARY: The hepatic contours and density are normal. There is
no intra- or extrahepatic biliary dilatation. Status post
cholecystectomy.

PANCREAS: The pancreatic parenchymal contours are normal and there
is no ductal dilatation. There is no peripancreatic fluid
collection.

SPLEEN: Normal.

ADRENALS/URINARY TRACT:

--Adrenal glands: Normal.

--Right kidney/ureter: No hydronephrosis, nephroureterolithiasis,
perinephric stranding or solid renal mass.

--Left kidney/ureter: No hydronephrosis, nephroureterolithiasis,
perinephric stranding or solid renal mass.

--Urinary bladder: Normal for degree of distention

STOMACH/BOWEL:

--Stomach/Duodenum: There is no hiatal hernia or other gastric
abnormality. The duodenal course and caliber are normal.

--Small bowel: No dilatation or inflammation.

--Colon: No focal abnormality.

--Appendix: Normal.

VASCULAR/LYMPHATIC: Atherosclerotic calcification is present within
the non-aneurysmal abdominal aorta, without hemodynamically
significant stenosis. The portal vein, splenic vein, superior
mesenteric vein and IVC are patent. No abdominal or pelvic
lymphadenopathy.

REPRODUCTIVE: Normal uterus and ovaries.

MUSCULOSKELETAL. Multilevel degenerative disc disease and facet
arthrosis. No bony spinal canal stenosis.

OTHER: None.
IMPRESSION: 1. No acute abdominal or pelvic abnormality.
2.  Aortic Atherosclerosis ([id number]).

## 2019-09-05 ENCOUNTER — Other Ambulatory Visit: Payer: Self-pay | Admitting: Family

## 2019-09-05 DIAGNOSIS — Z1231 Encounter for screening mammogram for malignant neoplasm of breast: Secondary | ICD-10-CM

## 2019-10-31 ENCOUNTER — Ambulatory Visit: Payer: Medicare HMO

## 2019-12-10 ENCOUNTER — Ambulatory Visit
Admission: RE | Admit: 2019-12-10 | Discharge: 2019-12-10 | Disposition: A | Discharge status: Home / Self Care | Payer: Medicare HMO | Source: Ambulatory Visit | Attending: Family | Admitting: Family

## 2019-12-10 ENCOUNTER — Other Ambulatory Visit: Payer: Self-pay

## 2019-12-10 DIAGNOSIS — Z1231 Encounter for screening mammogram for malignant neoplasm of breast: Secondary | ICD-10-CM

## 2020-01-02 ENCOUNTER — Ambulatory Visit (INDEPENDENT_AMBULATORY_CARE_PROVIDER_SITE_OTHER): Payer: Medicare HMO

## 2020-01-02 ENCOUNTER — Ambulatory Visit (INDEPENDENT_AMBULATORY_CARE_PROVIDER_SITE_OTHER): Payer: Medicare HMO | Admitting: Orthopaedic Surgery

## 2020-01-02 ENCOUNTER — Encounter: Payer: Self-pay | Admitting: Orthopaedic Surgery

## 2020-01-02 VITALS — Ht 66.0 in | Wt 210.0 lb

## 2020-01-02 DIAGNOSIS — M25562 Pain in left knee: Secondary | ICD-10-CM

## 2020-01-02 DIAGNOSIS — M1712 Unilateral primary osteoarthritis, left knee: Secondary | ICD-10-CM | POA: Diagnosis not present

## 2020-01-02 DIAGNOSIS — G8929 Other chronic pain: Secondary | ICD-10-CM | POA: Diagnosis not present

## 2020-01-02 MED ORDER — METHYLPREDNISOLONE ACETATE 40 MG/ML IJ SUSP
80.0000 mg | INTRAMUSCULAR | Status: AC | PRN
  Administered 2020-01-02: 80 mg via INTRA_ARTICULAR

## 2020-01-02 MED ORDER — LIDOCAINE HCL 1 % IJ SOLN
2.0000 mL | INTRAMUSCULAR | Status: AC | PRN
  Administered 2020-01-02: 2 mL

## 2020-01-02 MED ORDER — BUPIVACAINE HCL 0.5 % IJ SOLN
2.0000 mL | INTRAMUSCULAR | Status: AC | PRN
  Administered 2020-01-02: 2 mL via INTRA_ARTICULAR

## 2020-01-02 NOTE — Progress Notes (Signed)
Office Visit Note   Patient: Amber Roach           Date of Birth: Oct 27, 1945           MRN: [id number] Visit Date: 01/02/2020              Requested by: Wannetta Sender, [place] [facility] [address] [place],  [place] [address] PCP: Wannetta Sender, FNP   Assessment & Plan: Visit Diagnoses:  1. Chronic pain of left knee   2. Unilateral primary osteoarthritis, left knee     Plan: Long discussion regarding the osteoarthritis in the left knee with advanced changes.  Amber Roach would like to try cortisone injection as she has had good results in the past.  This was performed without difficulty.  She might want to consider Visco supplementation over time.  She will let us know Follow-Up Instructions: Return if symptoms worsen or fail to improve.   Orders:  Orders Placed This Encounter  Procedures  . Large Joint Inj: L knee  . XR KNEE 3 VIEW LEFT   No orders of the defined types were placed in this encounter.     Procedures: Large Joint Inj: L knee on 01/02/2020 12:21 PM Indications: pain and diagnostic evaluation Details: 25 G 1.5 in needle, anteromedial approach  Arthrogram: No  Medications: 2 mL lidocaine 1 %; 2 mL bupivacaine 0.5 %; 80 mg methylPREDNISolone acetate 40 MG/ML Procedure, treatment alternatives, risks and benefits explained, specific risks discussed. Consent was given by the patient. Patient was prepped and draped in the usual sterile fashion.       Clinical Data: No additional findings.   Subjective: Chief Complaint  Patient presents with  . Left Knee - Pain  Patient presents today for left knee pain. She states that it has been hurting for quite some time, but has worsened in the last month. No known injury. Her pain is located over the anterior medial area. She has been experiencing more pain at night and a burning sensation. She takes Tylenol as needed. No grinding, swelling, or giving  way.   HPI  Review of Systems   Objective: Vital Signs: Ht 5\' 6"  (1.676 m)   Wt 210 lb (95.3 kg)   BMI 33.89 kg/m   Physical Exam Constitutional:      Appearance: She is well-developed.  Eyes:     Pupils: Pupils are equal, round, and reactive to light.  Pulmonary:     Effort: Pulmonary effort is normal.  Skin:    General: Skin is warm and dry.  Neurological:     Mental Status: She is alert and oriented to person, place, and time.  Psychiatric:        Behavior: Behavior normal.     Ortho Exam awake alert and oriented x3.  Comfortable sitting.  Diffuse mild medial joint pain left knee.  Very small effusion.  Increased varus.  Some patellar crepitation but no pain with patellar compression.  No lateral joint pain.  Full extension and flexed about 153 to 4 degrees.  No instability.  No popliteal pain or mass Specialty Comments:  No specialty comments available.  Imaging: XR KNEE 3 VIEW LEFT  Result Date: 01/02/2020 Films of the left knee were obtained in several projections standing.  There is bone-on-bone in the medial compartment with subchondral sclerosis, peripheral osteophytes.  No ectopic calcification.  Approximately 1 to 2 degrees of varus.  Mild to moderate  degenerative changes about the patellofemoral joint with some irregularity particularly ill beneath the medial facet.  Minimal lateral tilt.  Films consistent with advanced osteoarthritis.  No acute changes or ectopic calcification    PMFS History: Patient Active Problem List   Diagnosis Date Noted  . Unilateral primary osteoarthritis, left knee 01/02/2020  . GERD (gastroesophageal reflux disease) 04/11/2013  . Obesity, unspecified 04/11/2013  . Metabolic syndrome 123456  . Mixed dyslipidemia 03/28/2013  . Achalasia 01/20/2011  . Hypertension 01/20/2011  . Vitamin D deficiency 01/20/2011   Past Medical History:  Diagnosis Date  . Hypertension     Family History  Problem Relation Age of Onset  .  Stroke Mother 74  . Stroke Father 59  . Hearing loss Brother     Past Surgical History:  Procedure Laterality Date  . BREAST BIOPSY    . CHOLECYSTECTOMY  1995  . [place]   Social History   Occupational History  . Not on file  Tobacco Use  . Smoking status: Never Smoker  . Smokeless tobacco: Never Used  Substance and Sexual Activity  . Alcohol use: Never  . Drug use: Never  . Sexual activity: Not on file

## 2020-11-05 ENCOUNTER — Encounter: Payer: Self-pay | Admitting: Orthopaedic Surgery

## 2020-11-05 ENCOUNTER — Ambulatory Visit (INDEPENDENT_AMBULATORY_CARE_PROVIDER_SITE_OTHER): Payer: Medicare HMO | Admitting: Orthopaedic Surgery

## 2020-11-05 ENCOUNTER — Other Ambulatory Visit: Payer: Self-pay

## 2020-11-05 VITALS — Ht 66.0 in | Wt 210.0 lb

## 2020-11-05 DIAGNOSIS — M1712 Unilateral primary osteoarthritis, left knee: Secondary | ICD-10-CM | POA: Diagnosis not present

## 2020-11-05 MED ORDER — LIDOCAINE HCL 1 % IJ SOLN
2.0000 mL | INTRAMUSCULAR | Status: AC | PRN
  Administered 2020-11-05: 2 mL

## 2020-11-05 MED ORDER — BUPIVACAINE HCL 0.5 % IJ SOLN
2.0000 mL | INTRAMUSCULAR | Status: AC | PRN
  Administered 2020-11-05: 2 mL via INTRA_ARTICULAR

## 2020-11-05 NOTE — Progress Notes (Signed)
Office Visit Note   Patient: Amber Roach           Date of Birth: [birth year]           MRN: [id number] Visit Date: 11/05/2020              Requested by: Wannetta Sender, [place] of [facility] [address] [place],  [place] [address] PCP: Wannetta Sender, FNP   Assessment & Plan: Visit Diagnoses:  1. Unilateral primary osteoarthritis, left knee     Plan: Recurrent symptoms of osteoarthritis left knee.  We will inject with cortisone and pre-CERT viscosupplementation.  Prior films demonstrated bone-on-bone in the medial compartment  Follow-Up Instructions: Return Pre-CERT viscosupplementation.   Orders:  Orders Placed This Encounter  Procedures  . Large Joint Inj: L knee   No orders of the defined types were placed in this encounter.     Procedures: Large Joint Inj: L knee on 11/05/2020 2:25 PM Indications: pain and diagnostic evaluation Details: 25 G 1.5 in needle, anteromedial approach  Arthrogram: No  Medications: 2 mL lidocaine 1 %; 2 mL bupivacaine 0.5 %  2 mL betamethasone injected the medial compartment left knee with Xylocaine and Marcaine Procedure, treatment alternatives, risks and benefits explained, specific risks discussed. Consent was given by the patient. Patient was prepped and draped in the usual sterile fashion.       Clinical Data: No additional findings.   Subjective: Chief Complaint  Patient presents with  . Left Knee - Pain  Patient presents today for left knee pain. She said that it hurts on and off. She received a cortisone injection in March of 2021. She said that it helped for two months. She said that her pain is located anterior and medial. It catches and grinds. No giving way. She takes Tylenol for pain relief.  HPI  Review of Systems   Objective: Vital Signs: Ht 5\' 6"  (1.676 m)   Wt 210 lb (95.3 kg)   BMI 33.89 kg/m   Physical Exam Constitutional:      Appearance:  She is well-developed and well-nourished.  HENT:     Mouth/Throat:     Mouth: Oropharynx is clear and moist.  Eyes:     Extraocular Movements: EOM normal.     Pupils: Pupils are equal, round, and reactive to light.  Pulmonary:     Effort: Pulmonary effort is normal.  Skin:    General: Skin is warm and dry.  Neurological:     Mental Status: She is alert and oriented to person, place, and time.  Psychiatric:        Mood and Affect: Mood and affect normal.        Behavior: Behavior normal.     Ortho Exam left knee without effusion but diffuse medial joint tenderness.  Some patellar crepitation but no pain with compression.  Full extension and flex to over 100 degrees without instability.  Knee was not hot warm or red.  Distal edema  Specialty Comments:  No specialty comments available.  Imaging: No results found.   PMFS History: Patient Active Problem List   Diagnosis Date Noted  . Unilateral primary osteoarthritis, left knee 01/02/2020  . GERD (gastroesophageal reflux disease) 04/11/2013  . Obesity, unspecified 04/11/2013  . Metabolic syndrome 38/18/2993  . Mixed dyslipidemia 03/28/2013  . Achalasia 01/20/2011  . Hypertension 01/20/2011  . Vitamin D deficiency 01/20/2011   Past Medical History:  Diagnosis Date  .  Hypertension     Family History  Problem Relation Age of Onset  . Stroke Mother 49  . Stroke Father 15  . Hearing loss Brother     Past Surgical History:  Procedure Laterality Date  . BREAST BIOPSY    . CHOLECYSTECTOMY  1995  . [place]   Social History   Occupational History  . Not on file  Tobacco Use  . Smoking status: Never Smoker  . Smokeless tobacco: Never Used  Vaping Use  . Vaping Use: Never used  Substance and Sexual Activity  . Alcohol use: Never  . Drug use: Never  . Sexual activity: Not on file

## 2020-12-16 ENCOUNTER — Other Ambulatory Visit: Payer: Self-pay | Admitting: Family

## 2020-12-16 DIAGNOSIS — Z1231 Encounter for screening mammogram for malignant neoplasm of breast: Secondary | ICD-10-CM

## 2021-01-14 ENCOUNTER — Ambulatory Visit (INDEPENDENT_AMBULATORY_CARE_PROVIDER_SITE_OTHER): Payer: Medicare HMO | Admitting: Orthopaedic Surgery

## 2021-01-14 ENCOUNTER — Encounter: Payer: Self-pay | Admitting: Orthopaedic Surgery

## 2021-01-14 ENCOUNTER — Other Ambulatory Visit: Payer: Self-pay

## 2021-01-14 VITALS — Ht 66.0 in | Wt 210.0 lb

## 2021-01-14 DIAGNOSIS — M1712 Unilateral primary osteoarthritis, left knee: Secondary | ICD-10-CM | POA: Diagnosis not present

## 2021-01-14 MED ORDER — HYLAN G-F 20 16 MG/2ML IX SOSY
16.0000 mg | PREFILLED_SYRINGE | INTRA_ARTICULAR | Status: AC | PRN
  Administered 2021-01-14: 16 mg via INTRA_ARTICULAR

## 2021-01-14 NOTE — Progress Notes (Signed)
   Office Visit Note   Patient: Amber Roach           Date of Birth: [birth year]           MRN: [id number] Visit Date: 01/14/2021              Requested by: Wannetta Sender, [place] [facility] [address] [place],  [place] [address] PCP: Wannetta Sender, FNP   Assessment & Plan: Visit Diagnoses:  1. Unilateral primary osteoarthritis, left knee     Plan: First Synvisc injection left knee.  Return weekly for the next 2 weeks to complete the series  Follow-Up Instructions: Return in about 1 week (around 01/21/2021).   Orders:  Orders Placed This Encounter  Procedures  . Large Joint Inj: L knee   No orders of the defined types were placed in this encounter.     Procedures: Large Joint Inj: L knee on 01/14/2021 11:36 AM Indications: pain and joint swelling Details: 25 G 1.5 in needle, anteromedial approach  Arthrogram: No  Medications: 16 mg Hylan 16 MG/2ML Outcome: tolerated well, no immediate complications Procedure, treatment alternatives, risks and benefits explained, specific risks discussed. Consent was given by the patient. Immediately prior to procedure a time out was called to verify the correct patient, procedure, equipment, support staff and site/side marked as required. Patient was prepped and draped in the usual sterile fashion.       Clinical Data: No additional findings.   Subjective: Chief Complaint  Patient presents with  . Left Knee - Follow-up    Synvisc started 01/14/2021  Patient presents today for the first Synvisc injection in her left knee.   HPI  Review of Systems   Objective: Vital Signs: Ht 5\' 6"  (1.676 m)   Wt 210 lb (95.3 kg)   BMI 33.89 kg/m   Physical Exam  Ortho Exam left knee was not hot warm or red.  Little if any effusion.  Mostly medial joint pain which was mild.  Full extension of flexed well over 90 degrees without instability  Specialty Comments:  No  specialty comments available.  Imaging: No results found.   PMFS History: Patient Active Problem List   Diagnosis Date Noted  . Unilateral primary osteoarthritis, left knee 01/02/2020  . GERD (gastroesophageal reflux disease) 04/11/2013  . Obesity, unspecified 04/11/2013  . Metabolic syndrome 31/43/8887  . Mixed dyslipidemia 03/28/2013  . Achalasia 01/20/2011  . Hypertension 01/20/2011  . Vitamin D deficiency 01/20/2011   Past Medical History:  Diagnosis Date  . Hypertension     Family History  Problem Relation Age of Onset  . Stroke Mother 71  . Stroke Father 64  . Hearing loss Brother     Past Surgical History:  Procedure Laterality Date  . BREAST BIOPSY    . CHOLECYSTECTOMY  1995  . [place]   Social History   Occupational History  . Not on file  Tobacco Use  . Smoking status: Never Smoker  . Smokeless tobacco: Never Used  Vaping Use  . Vaping Use: Never used  Substance and Sexual Activity  . Alcohol use: Never  . Drug use: Never  . Sexual activity: Not on file

## 2021-01-21 ENCOUNTER — Ambulatory Visit (INDEPENDENT_AMBULATORY_CARE_PROVIDER_SITE_OTHER): Payer: Medicare HMO | Admitting: Orthopaedic Surgery

## 2021-01-21 ENCOUNTER — Other Ambulatory Visit: Payer: Self-pay

## 2021-01-21 ENCOUNTER — Encounter: Payer: Self-pay | Admitting: Orthopaedic Surgery

## 2021-01-21 DIAGNOSIS — G8929 Other chronic pain: Secondary | ICD-10-CM

## 2021-01-21 DIAGNOSIS — M1712 Unilateral primary osteoarthritis, left knee: Secondary | ICD-10-CM

## 2021-01-21 DIAGNOSIS — M25562 Pain in left knee: Secondary | ICD-10-CM

## 2021-01-21 NOTE — Progress Notes (Signed)
This patient is diagnosed with osteoarthritis of the knee(s).    Radiographs show evidence of joint space narrowing, osteophytes, subchondral sclerosis and/or subchondral cysts.  This patient has knee pain which interferes with functional and activities of daily living.    This patient has experienced inadequate response, adverse effects and/or intolerance with conservative treatments such as acetaminophen, NSAIDS, topical creams, physical therapy or regular exercise, knee bracing and/or weight loss.   This patient has experienced inadequate response or has a contraindication to intra articular steroid injections for at least 3 months.   This patient is not scheduled to have a total knee replacement within 6 months of starting treatment with viscosupplementation.  PROCEDURE NOTE:  Synvisc injection #  2 of 3   Injection 1 vial of Synvisc into left  knee  There were no vitals taken for this visit.  The left knee exam: there was no synovitis or infection   The knee was prepped sterilely  Ethyl chloride was used to anesthetize the skin A 20 g needle was used to inject the knee with 1 vial of Synvisc A sterile dressing was placed  There were no complications  Encounter Diagnosis  Name Primary?  . Chronic pain of left knee Yes    Follow up one week  Electronically Signed Sanjuana Kava, MD 3/30/202210:07 AM

## 2021-01-29 ENCOUNTER — Other Ambulatory Visit: Payer: Self-pay

## 2021-01-29 ENCOUNTER — Ambulatory Visit (INDEPENDENT_AMBULATORY_CARE_PROVIDER_SITE_OTHER): Payer: Medicare HMO | Admitting: Orthopaedic Surgery

## 2021-01-29 DIAGNOSIS — M1712 Unilateral primary osteoarthritis, left knee: Secondary | ICD-10-CM | POA: Diagnosis not present

## 2021-01-29 MED ORDER — LIDOCAINE HCL 1 % IJ SOLN
0.5000 mL | INTRAMUSCULAR | Status: AC | PRN
  Administered 2021-01-29: .5 mL

## 2021-01-29 MED ORDER — HYLAN G-F 20 16 MG/2ML IX SOSY
16.0000 mg | PREFILLED_SYRINGE | INTRA_ARTICULAR | Status: AC | PRN
  Administered 2021-01-29: 16 mg via INTRA_ARTICULAR

## 2021-01-29 NOTE — Progress Notes (Signed)
   Office Visit Note   Patient: Amber Roach           Date of Birth: 05/16/1946           MRN: [id number] Visit Date: 01/29/2021              Requested by: Wannetta Sender, [place] [facility] [address] [place],  [place] [address] PCP: Wannetta Sender, FNP   Assessment & Plan: Visit Diagnoses:  1. Unilateral primary osteoarthritis, left knee     Plan: Third Synvisc injection performed left knee she tolerated it well.  Follow-Up Instructions: No follow-ups on file.   Orders:  Orders Placed This Encounter  Procedures  . Large Joint Inj   No orders of the defined types were placed in this encounter.     Procedures: Large Joint Inj: L knee on 01/29/2021 11:33 AM Indications: joint swelling and pain Details: 22 G 1.5 in needle, anterolateral approach  Arthrogram: No  Medications: 0.5 mL lidocaine 1 %; 16 mg Hylan 16 MG/2ML Outcome: tolerated well, no immediate complications Procedure, treatment alternatives, risks and benefits explained, specific risks discussed. Consent was given by the patient. Immediately prior to procedure a time out was called to verify the correct patient, procedure, equipment, support staff and site/side marked as required. Patient was prepped and draped in the usual sterile fashion.       Clinical Data: No additional findings.   Subjective: No chief complaint on file.   HPI patient returns for third Synvisc injection left knee.  No problems with previous 2.  She has noticed some improvement.  Review of Systems unchanged   Objective: Vital Signs: There were no vitals taken for this visit.  Physical Exam unchanged  Ortho Exam no reaction from previous injection.  Specialty Comments:  No specialty comments available.  Imaging: No results found.   PMFS History: Patient Active Problem List   Diagnosis Date Noted  . Unilateral primary osteoarthritis, left knee 01/02/2020   . GERD (gastroesophageal reflux disease) 04/11/2013  . Obesity, unspecified 04/11/2013  . Metabolic syndrome 73/53/2992  . Mixed dyslipidemia 03/28/2013  . Achalasia 01/20/2011  . Hypertension 01/20/2011  . Vitamin D deficiency 01/20/2011   Past Medical History:  Diagnosis Date  . Hypertension     Family History  Problem Relation Age of Onset  . Stroke Mother 63  . Stroke Father 5  . Hearing loss Brother     Past Surgical History:  Procedure Laterality Date  . BREAST BIOPSY    . CHOLECYSTECTOMY  1995  . [place]   Social History   Occupational History  . Not on file  Tobacco Use  . Smoking status: Never Smoker  . Smokeless tobacco: Never Used  Vaping Use  . Vaping Use: Never used  Substance and Sexual Activity  . Alcohol use: Never  . Drug use: Never  . Sexual activity: Not on file

## 2021-02-04 ENCOUNTER — Ambulatory Visit
Admission: RE | Admit: 2021-02-04 | Discharge: 2021-02-04 | Disposition: A | Discharge status: Home / Self Care | Payer: Medicare HMO | Source: Ambulatory Visit | Attending: Family | Admitting: Family

## 2021-02-04 ENCOUNTER — Other Ambulatory Visit: Payer: Self-pay

## 2021-02-04 DIAGNOSIS — Z1231 Encounter for screening mammogram for malignant neoplasm of breast: Secondary | ICD-10-CM

## 2021-10-24 ENCOUNTER — Emergency Department (HOSPITAL_COMMUNITY): Payer: Medicare HMO

## 2021-10-24 ENCOUNTER — Other Ambulatory Visit: Payer: Self-pay

## 2021-10-24 ENCOUNTER — Emergency Department (HOSPITAL_COMMUNITY)
Admission: EM | Admit: 2021-10-24 | Discharge: 2021-10-24 | Disposition: A | Discharge status: Home / Self Care | Payer: Medicare HMO | Attending: Emergency Medicine | Admitting: Emergency Medicine

## 2021-10-24 ENCOUNTER — Encounter (HOSPITAL_COMMUNITY): Payer: Self-pay | Admitting: *Deleted

## 2021-10-24 DIAGNOSIS — R519 Headache, unspecified: Secondary | ICD-10-CM | POA: Diagnosis present

## 2021-10-24 DIAGNOSIS — G44209 Tension-type headache, unspecified, not intractable: Secondary | ICD-10-CM | POA: Diagnosis not present

## 2021-10-24 DIAGNOSIS — Z79899 Other long term (current) drug therapy: Secondary | ICD-10-CM | POA: Insufficient documentation

## 2021-10-24 DIAGNOSIS — Z20822 Contact with and (suspected) exposure to covid-19: Secondary | ICD-10-CM | POA: Insufficient documentation

## 2021-10-24 DIAGNOSIS — I10 Essential (primary) hypertension: Secondary | ICD-10-CM | POA: Diagnosis not present

## 2021-10-24 DIAGNOSIS — R112 Nausea with vomiting, unspecified: Secondary | ICD-10-CM | POA: Diagnosis not present

## 2021-10-24 LAB — URINALYSIS, ROUTINE W REFLEX MICROSCOPIC
Bacteria, UA: NONE SEEN
Bilirubin Urine: NEGATIVE
Glucose, UA: NEGATIVE mg/dL
Hgb urine dipstick: NEGATIVE
Ketones, ur: NEGATIVE mg/dL
Nitrite: NEGATIVE
Protein, ur: NEGATIVE mg/dL
Specific Gravity, Urine: 1.015 (ref 1.005–1.030)
pH: 6 (ref 5.0–8.0)

## 2021-10-24 LAB — CBC
HCT: 38 % (ref 36.0–46.0)
Hemoglobin: 12.9 g/dL (ref 12.0–15.0)
MCH: 30 pg (ref 26.0–34.0)
MCHC: 33.9 g/dL (ref 30.0–36.0)
MCV: 88.4 fL (ref 80.0–100.0)
Platelets: 272 10*3/uL (ref 150–400)
RBC: 4.3 MIL/uL (ref 3.87–5.11)
RDW: 13.9 % (ref 11.5–15.5)
WBC: 7 10*3/uL (ref 4.0–10.5)
nRBC: 0 % (ref 0.0–0.2)

## 2021-10-24 LAB — RESP PANEL BY RT-PCR (FLU A&B, COVID) ARPGX2
Influenza A by PCR: NEGATIVE
Influenza B by PCR: NEGATIVE
SARS Coronavirus 2 by RT PCR: NEGATIVE

## 2021-10-24 LAB — COMPREHENSIVE METABOLIC PANEL
ALT: 30 U/L (ref 0–44)
AST: 24 U/L (ref 15–41)
Albumin: 4.1 g/dL (ref 3.5–5.0)
Alkaline Phosphatase: 54 U/L (ref 38–126)
Anion gap: 11 (ref 5–15)
BUN: 10 mg/dL (ref 8–23)
CO2: 25 mmol/L (ref 22–32)
Calcium: 9.5 mg/dL (ref 8.9–10.3)
Chloride: 98 mmol/L (ref 98–111)
Creatinine, Ser: 0.64 mg/dL (ref 0.44–1.00)
GFR, Estimated: >60 mL/min (ref >60)
Glucose, Bld: 155 mg/dL — ABNORMAL HIGH (ref 70–99)
Potassium: 3.7 mmol/L (ref 3.5–5.1)
Sodium: 134 mmol/L — ABNORMAL LOW (ref 135–145)
Total Bilirubin: 0.7 mg/dL (ref 0.3–1.2)
Total Protein: 7.6 g/dL (ref 6.5–8.1)

## 2021-10-24 MED ORDER — ONDANSETRON HCL 4 MG PO TABS: 4.0000 mg | ORAL_TABLET | Freq: Four times a day (QID) | ORAL | 0 refills | Status: DC

## 2021-10-24 MED ORDER — ONDANSETRON HCL 4 MG/2ML IJ SOLN
4.0000 mg | INTRAMUSCULAR | Status: AC
  Administered 2021-10-24: 4 mg via INTRAVENOUS
  Filled 2021-10-24: qty 2

## 2021-10-24 MED ORDER — SODIUM CHLORIDE 0.9 % IV SOLN: INTRAVENOUS | Status: DC

## 2021-10-24 NOTE — ED Notes (Signed)
Rec'd d/c papers. All questions answered. Iv removed.

## 2021-10-24 NOTE — ED Triage Notes (Signed)
Pt reports HA started last night and today began to have N/V/D, denies fevers.  +chills.

## 2021-10-24 NOTE — ED Provider Notes (Signed)
[facility] Provider Note   CSN: [id number] Arrival date & time: 10/24/21  1721     History Chief Complaint  Patient presents with   Headache    Amber Roach is a 75 y.o. female.   Headache Associated symptoms: diarrhea, nausea and vomiting   Associated symptoms: no abdominal pain, no cough and no fever    This patient is a 75 year old female, she has a history of metabolic syndrome and recently started on metformin, she has recently run out within the last couple of days.  She has a history of hypertension and treats her hypertension with a combination of lisinopril and hydrochlorothiazide.  She also takes pantoprazole for chronic acid reflux.  Interestingly the patient had achalasia when she was younger, she had a major surgery during which time they had to cut out a part of her esophagus and she has been left with chronic acid reflux since that time.  The patient presents today with several complaints primarily of a headache which is diffuse, severe and some associated vomiting.  She has been vomiting profusely all through the day including some dry heaving.  She feels like she has had chills but has not measured a fever.  She has had chronic diarrhea which is not new.  She has had no medications prior to arrival.  She also has some associated urinary frequency today despite not having very much to eat or drink.  She was able to take her home medications including her blood pressure medication prior to arrival.  No sick contacts, she did go to some family member's house for Christmas but does not recall anybody else being sick.  Her husband was at the bedside is an additional historian and states that he has not been sick at all.  Past Medical History:  Diagnosis Date   Hypertension     Patient Active Problem List   Diagnosis Date Noted   Unilateral primary osteoarthritis, left knee 01/02/2020   GERD (gastroesophageal reflux disease) 04/11/2013    Obesity, unspecified 95/18/8416   Metabolic syndrome 60/63/0160   Mixed dyslipidemia 03/28/2013   Achalasia 01/20/2011   Hypertension 01/20/2011   Vitamin D deficiency 01/20/2011    Past Surgical History:  Procedure Laterality Date   BREAST BIOPSY     [place]     OB History   No obstetric history on file.     Family History  Problem Relation Age of Onset   Stroke Mother 41   Stroke Father 82   Hearing loss Brother     Social History   Tobacco Use   Smoking status: Never   Smokeless tobacco: Never  Vaping Use   Vaping Use: Never used  Substance Use Topics   Alcohol use: Never   Drug use: Never    Home Medications Prior to Admission medications   Medication Sig Start Date End Date Taking? Authorizing Provider  ondansetron (ZOFRAN) 4 MG tablet Take 1 tablet (4 mg total) by mouth every 6 (six) hours. 10/24/21  Yes Noemi Chapel, MD  calcium-vitamin D 250-100 MG-UNIT per tablet Take 1 tablet by mouth daily. Patient not taking: Reported on 01/21/2021    [provider]  cholecalciferol (VITAMIN D) 1000 UNITS tablet Take 1 tablet (1,000 Units total) by mouth daily. Patient not taking: Reported on 01/21/2021 04/11/13   Vernie Shanks, MD  fenofibrate 54 MG tablet Take 1 tablet (54 mg  total) by mouth daily. Patient not taking: Reported on 01/21/2021 04/16/13   Vernie Shanks, MD  lisinopril-hydrochlorothiazide (PRINZIDE,ZESTORETIC) 20-12.5 MG per tablet TAKE ONE TABLET BY MOUTH ONE TIME DAILY 04/11/13   Vernie Shanks, MD  Omega-3 Fatty Acids (FISH OIL) 1000 MG CAPS Take 2 capsules by mouth 2 (two) times daily. Patient not taking: Reported on 01/21/2021    [provider]  oxyCODONE-acetaminophen (PERCOCET) 5-325 MG tablet Take 1 tablet by mouth every 4 (four) hours as needed for moderate pain. Patient not taking: Reported on 4/33/2951 06/01/40   Delora Fuel, MD  oxyCODONE-acetaminophen  (PERCOCET) 5-325 MG tablet Take 1 tablet by mouth every 4 (four) hours as needed for moderate pain. Patient not taking: Reported on 6/60/6301 6/0/10   Delora Fuel, MD  pantoprazole (PROTONIX) 40 MG tablet TAKE  ONE TABLET BY MOUTH EVERY MORNING 04/11/13   Vernie Shanks, MD    Allergies    Aspirin, Codeine, Zocor [simvastatin], and Nsaids  Review of Systems   Review of Systems  Constitutional:  Positive for chills. Negative for fever.  Respiratory:  Negative for cough and shortness of breath.   Gastrointestinal:  Positive for diarrhea, nausea and vomiting. Negative for abdominal pain.  Neurological:  Positive for headaches.  All other systems reviewed and are negative.  Physical Exam Updated Vital Signs BP 140/63    Pulse 66    Temp 98.3 F (36.8 C) (Oral)    Resp 17    Ht 1.676 m (5\' 6" )    Wt 90.7 kg    SpO2 95%    BMI 32.28 kg/m   Physical Exam Vitals and nursing note reviewed.  Constitutional:      General: She is not in acute distress.    Appearance: She is well-developed.     Comments: The patient appears uncomfortable until she starts vomiting during which time she appears ill, then when it stopped she goes back to just feeling uncomfortable  HENT:     Head: Normocephalic and atraumatic.     Mouth/Throat:     Mouth: Mucous membranes are moist.     Pharynx: No oropharyngeal exudate.  Eyes:     General: No scleral icterus.       Right eye: No discharge.        Left eye: No discharge.     Conjunctiva/sclera: Conjunctivae normal.     Pupils: Pupils are equal, round, and reactive to light.  Neck:     Thyroid: No thyromegaly.     Vascular: No JVD.  Cardiovascular:     Rate and Rhythm: Normal rate and regular rhythm.     Heart sounds: Normal heart sounds. No murmur heard.   No friction rub. No gallop.  Pulmonary:     Effort: Pulmonary effort is normal. No respiratory distress.     Breath sounds: Normal breath sounds. No wheezing or rales.  Abdominal:     General:  Bowel sounds are normal. There is no distension.     Palpations: Abdomen is soft. There is no mass.     Tenderness: There is no abdominal tenderness.     Comments: No abdominal tenderness, no CVA tender  Musculoskeletal:        General: No swelling or tenderness. Normal range of motion.     Cervical back: Normal range of motion and neck supple.     Right lower leg: No edema.     Left lower leg: No edema.  Lymphadenopathy:     Cervical:  No cervical adenopathy.  Skin:    General: Skin is warm and dry.     Findings: No erythema or rash.  Neurological:     Mental Status: She is alert.     Coordination: Coordination normal.     Comments: The patient is awake alert and able to follow all of my commands.  She has normal speech, cranial nerves III through XII are normal, she otherwise appears to have a normal neurologic exam  Psychiatric:        Behavior: Behavior normal.    ED Results / Procedures / Treatments   Labs (all labs ordered are listed, but only abnormal results are displayed) Labs Reviewed  COMPREHENSIVE METABOLIC PANEL - Abnormal; Notable for the following components:      Result Value   Sodium 134 (*)    Glucose, Bld 155 (*)    All other components within normal limits  URINALYSIS, ROUTINE W REFLEX MICROSCOPIC - Abnormal; Notable for the following components:   Leukocytes,Ua SMALL (*)    Non Squamous Epithelial 0-5 (*)    All other components within normal limits  RESP PANEL BY RT-PCR (FLU A&B, COVID) ARPGX2  URINE CULTURE  CBC    EKG None  Radiology CT Head Wo Contrast  Result Date: 10/24/2021 CLINICAL DATA:  Headache. EXAM: CT HEAD WITHOUT CONTRAST TECHNIQUE: Contiguous axial images were obtained from the base of the skull through the vertex without intravenous contrast. COMPARISON:  None. 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 No midline shift is noted. Ventricular size is within normal limits. There is no  evidence of hemorrhage or acute infarction. Vascular: No hyperdense vessel or unexpected calcification. Skull: Normal. Negative for fracture or focal lesion. Sinuses/Orbits: No acute finding. Other: None. 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 MRI is recommended for further evaluation. Electronically Signed   By: Marijo Conception M.D.   On: 10/24/2021 18:41   DG Chest Port 1 View  Result Date: 10/24/2021 CLINICAL DATA:  Achalasia history.  Vomiting. EXAM: PORTABLE CHEST 1 VIEW COMPARISON:  None. FINDINGS: Cardiac silhouette is normal in size and configuration. Two surgical vascular clips project over the cardiac silhouette. Normal mediastinal and hilar contours. Clear lungs.  No pleural effusion or pneumothorax. Skeletal structures are grossly intact. IMPRESSION: No active disease. Electronically Signed   By: Lajean Manes M.D.   On: 10/24/2021 18:29    Procedures Procedures   Medications Ordered in ED Medications  0.9 %  sodium chloride infusion ( Intravenous New Bag/Given 10/24/21 1810)  ondansetron (ZOFRAN) injection 4 mg (4 mg Intravenous Given 10/24/21 1810)    ED Course  I have reviewed the triage vital signs and the nursing notes.  Pertinent labs & imaging results that were available during my care of the patient were reviewed by me and considered in my medical decision making (see chart for details).    MDM Rules/Calculators/A&P                          This patient presents to the ED for concern of headache nausea and vomiting, this involves an extensive number of treatment options, and is a complaint that carries with it a high risk of complications and morbidity.  The differential diagnosis includes possible intracranial abnormalities especially given the patient severe hypertension, would also consider infection given COVID and flu at surgeon levels right now with subjective fevers and chills.  Would also  consider urine infection given her dysuria  and frequency over the course of the day   Co morbidities that complicate the patient evaluation  Hypertension, diabetes History of achalasia with major surgery, doubt that this is complicating it as the patient has had acid reflux since that time with no recurrent achalasia   Additional history obtained:  Additional history obtained from family member as well as the electronic medical record External records from outside source obtained and reviewed including the patient has been for office visits for osteoarthritis, chronic knee pain, she has also been seen for a positive COVID test which occurred about a year ago.   Lab Tests:  I Ordered, and personally interpreted labs.  The pertinent results include: CBC and metabolic panel both of which were rather unremarkable.  Urinalysis was also unremarkable,   Imaging Studies ordered:  I ordered imaging studies including CT scan of the brain, chest x-ray I independently visualized and interpreted imaging which showed no signs of abnormalities except for a small cyst on the brain, chest x-ray without any acute findings whatsoever I agree with the radiologist interpretation   Cardiac Monitoring:  The patient was maintained on a cardiac monitor.  I personally viewed and interpreted the cardiac monitored which showed an underlying rhythm of: Normal sinus rhythm, initially blood pressure was very elevated but this improved spontaneously, and discharge her blood pressure was 140/63   Medicines ordered and prescription drug management:  I ordered medication including Zofran and IV fluids for headache and nausea Reevaluation of the patient after these medicines showed that the patient improved I have reviewed the patients home medicines and have made adjustments as needed   Test Considered:  Considered MRI but not available at this hour and the patient has a normal neurologic exam with resolved headache   Critical Interventions:  IV  fluids Antiemetics    Problem List / ED Course:  Headache, patient evaluated, CT scan does not show source, better with fluids Nausea and vomiting, patient states she is under significant amount of stress and considers that this may be causing her nausea.  She is completely better at discharge Cyst on the brain, referred to family doctor, given a copy of her CT scan for discharge Hypertension, resolved with fluid   Reevaluation:  After the interventions noted above, I reevaluated the patient and found that they have :improved   Social Determinants of Health:  None   Dispostion:  After consideration of the diagnostic results and the patients response to treatment, I feel that the patent would benefit from discharge home.       Final Clinical Impression(s) / ED Diagnoses Final diagnoses:  Nonintractable headache, unspecified chronicity pattern, unspecified headache type  Nausea and vomiting, unspecified vomiting type    Rx / DC Orders ED Discharge Orders          Ordered    ondansetron (ZOFRAN) 4 MG tablet  Every 6 hours        10/24/21 2155             Noemi Chapel, MD 10/24/21 2157

## 2021-10-24 NOTE — Discharge Instructions (Signed)
Your testing has not shown any specific abnormalities other than the small cyst on your brain, you should share this with your doctor and let them follow-up with it.  It does not appear to be causing problems this evening.   Zofran every 6 hours as needed for nasuea

## 2021-10-25 ENCOUNTER — Telehealth (HOSPITAL_COMMUNITY): Payer: Self-pay | Admitting: Student

## 2021-10-25 MED ORDER — ONDANSETRON HCL 4 MG PO TABS: 4.0000 mg | ORAL_TABLET | Freq: Four times a day (QID) | ORAL | 0 refills | Status: DC | PRN

## 2021-10-25 NOTE — Telephone Encounter (Signed)
Seen by ED provider yesterday Zofran sent to pharmacy that was closed today, patient requested present to an open pharmacy today sent in per their request.

## 2021-10-26 ENCOUNTER — Emergency Department (HOSPITAL_COMMUNITY)
Admission: EM | Admit: 2021-10-26 | Discharge: 2021-10-26 | Discharge status: Against Medical Advice | Payer: Medicare HMO

## 2021-10-26 LAB — URINE CULTURE: Culture: NO GROWTH

## 2021-10-26 NOTE — ED Notes (Signed)
Pt's family to the desk, stated it would take too long and they were going to another hospital. Advised family that pt hasn't been triaged, continued to state they would be going elsewhere as the wait would be too long.

## 2021-11-02 ENCOUNTER — Other Ambulatory Visit: Payer: Self-pay | Admitting: Internal Medicine

## 2021-11-02 DIAGNOSIS — G930 Cerebral cysts: Secondary | ICD-10-CM

## 2021-11-04 ENCOUNTER — Other Ambulatory Visit: Payer: Self-pay

## 2021-11-04 ENCOUNTER — Ambulatory Visit
Admission: RE | Admit: 2021-11-04 | Discharge: 2021-11-04 | Disposition: A | Discharge status: Home / Self Care | Payer: Medicare HMO | Source: Ambulatory Visit | Attending: Internal Medicine | Admitting: Internal Medicine

## 2021-11-04 DIAGNOSIS — G930 Cerebral cysts: Secondary | ICD-10-CM

## 2021-11-04 MED ORDER — GADOBENATE DIMEGLUMINE 529 MG/ML IV SOLN
19.0000 mL | Freq: Once | INTRAVENOUS | Status: AC | PRN
  Administered 2021-11-04: 19 mL via INTRAVENOUS

## 2021-11-13 ENCOUNTER — Other Ambulatory Visit: Payer: Medicare HMO

## 2021-11-17 ENCOUNTER — Other Ambulatory Visit (HOSPITAL_COMMUNITY): Payer: Self-pay | Admitting: Neurosurgery

## 2021-11-17 DIAGNOSIS — G459 Transient cerebral ischemic attack, unspecified: Secondary | ICD-10-CM

## 2021-11-18 ENCOUNTER — Ambulatory Visit (HOSPITAL_COMMUNITY)
Admission: RE | Admit: 2021-11-18 | Discharge: 2021-11-18 | Disposition: A | Discharge status: Home / Self Care | Payer: Medicare HMO | Source: Ambulatory Visit | Attending: Neurosurgery | Admitting: Neurosurgery

## 2021-11-18 ENCOUNTER — Other Ambulatory Visit: Payer: Self-pay

## 2021-11-18 DIAGNOSIS — G459 Transient cerebral ischemic attack, unspecified: Secondary | ICD-10-CM

## 2022-01-27 ENCOUNTER — Other Ambulatory Visit: Payer: Self-pay | Admitting: Internal Medicine

## 2022-01-27 DIAGNOSIS — Z1231 Encounter for screening mammogram for malignant neoplasm of breast: Secondary | ICD-10-CM

## 2022-02-05 ENCOUNTER — Ambulatory Visit: Payer: Medicare HMO

## 2022-03-29 ENCOUNTER — Ambulatory Visit: Payer: Medicare HMO

## 2022-04-01 ENCOUNTER — Ambulatory Visit
Admission: RE | Admit: 2022-04-01 | Discharge: 2022-04-01 | Disposition: A | Discharge status: Home / Self Care | Payer: Medicare HMO | Source: Ambulatory Visit | Attending: Internal Medicine | Admitting: Internal Medicine

## 2022-04-01 DIAGNOSIS — Z1231 Encounter for screening mammogram for malignant neoplasm of breast: Secondary | ICD-10-CM

## 2022-07-08 ENCOUNTER — Other Ambulatory Visit: Payer: Self-pay

## 2022-07-08 ENCOUNTER — Encounter (HOSPITAL_BASED_OUTPATIENT_CLINIC_OR_DEPARTMENT_OTHER): Payer: Self-pay

## 2022-07-08 ENCOUNTER — Emergency Department (HOSPITAL_BASED_OUTPATIENT_CLINIC_OR_DEPARTMENT_OTHER)
Admission: EM | Admit: 2022-07-08 | Discharge: 2022-07-08 | Disposition: A | Discharge status: Home / Self Care | Payer: Medicare HMO | Attending: Emergency Medicine | Admitting: Emergency Medicine

## 2022-07-08 DIAGNOSIS — Z79899 Other long term (current) drug therapy: Secondary | ICD-10-CM | POA: Insufficient documentation

## 2022-07-08 DIAGNOSIS — R519 Headache, unspecified: Secondary | ICD-10-CM | POA: Diagnosis present

## 2022-07-08 DIAGNOSIS — I10 Essential (primary) hypertension: Secondary | ICD-10-CM | POA: Diagnosis not present

## 2022-07-08 LAB — BASIC METABOLIC PANEL
Anion gap: 12 (ref 5–15)
BUN: 9 mg/dL (ref 8–23)
CO2: 25 mmol/L (ref 22–32)
Calcium: 10.2 mg/dL (ref 8.9–10.3)
Chloride: 99 mmol/L (ref 98–111)
Creatinine, Ser: 0.72 mg/dL (ref 0.44–1.00)
GFR, Estimated: >60 mL/min (ref >60)
Glucose, Bld: 110 mg/dL — ABNORMAL HIGH (ref 70–99)
Potassium: 3.5 mmol/L (ref 3.5–5.1)
Sodium: 136 mmol/L (ref 135–145)

## 2022-07-08 LAB — CBC
HCT: 36.9 % (ref 36.0–46.0)
Hemoglobin: 12.3 g/dL (ref 12.0–15.0)
MCH: 29 pg (ref 26.0–34.0)
MCHC: 33.3 g/dL (ref 30.0–36.0)
MCV: 87 fL (ref 80.0–100.0)
Platelets: 287 10*3/uL (ref 150–400)
RBC: 4.24 MIL/uL (ref 3.87–5.11)
RDW: 13.3 % (ref 11.5–15.5)
WBC: 6 10*3/uL (ref 4.0–10.5)
nRBC: 0 % (ref 0.0–0.2)

## 2022-07-08 NOTE — ED Triage Notes (Signed)
Patient here POV from Home.  Endorses Headache that began Tuesday and Subsided Somewhat. Began again today and is associated with HTN (172/90 Today Highest).  History of Controlled HTN. No Fevers. No Fevers. No CP. No SOB.  NAD Noted during Triage. A&Ox4. GCS 15. Ambulatory.

## 2022-07-08 NOTE — Discharge Instructions (Signed)
The headache could be due to the blood pressure being high.  It is improved now.  Increase the blood pressure medicine as you have been instructed.  Follow-up with your doctor as needed.

## 2022-07-08 NOTE — ED Notes (Signed)
Pt complains of HA states her BP has been high and PCP has adjusted her meds and BP has improved

## 2022-07-12 NOTE — ED Provider Notes (Signed)
[facility] EMERGENC[facility] DEPT Provider Note   CSN: [id number] Arrival date & time: 07/08/22  1945     History  Chief Complaint  Patient presents with   Headache    Amber Roach is a 76 y.o. female.   Headache Patient presents with hypertension.  Headache.  History of both.  Has been adjusting medicines by PCP.  Blood pressure improved somewhat upon arrival.  No numbness or weakness.  No confusion.    Past Medical History:  Diagnosis Date   Hypertension     Home Medications Prior to Admission medications   Medication Sig Start Date End Date Taking? Authorizing Provider  calcium-vitamin D 250-100 MG-UNIT per tablet Take 1 tablet by mouth daily. Patient not taking: Reported on 01/21/2021    [provider]  cholecalciferol (VITAMIN D) 1000 UNITS tablet Take 1 tablet (1,000 Units total) by mouth daily. Patient not taking: Reported on 01/21/2021 04/11/13   Vernie Shanks, MD  fenofibrate 54 MG tablet Take 1 tablet (54 mg total) by mouth daily. Patient not taking: Reported on 01/21/2021 04/16/13   Vernie Shanks, MD  lisinopril-hydrochlorothiazide (PRINZIDE,ZESTORETIC) 20-12.5 MG per tablet TAKE ONE TABLET BY MOUTH ONE TIME DAILY 04/11/13   Vernie Shanks, MD  Omega-3 Fatty Acids (FISH OIL) 1000 MG CAPS Take 2 capsules by mouth 2 (two) times daily. Patient not taking: Reported on 01/21/2021    [provider]  ondansetron (ZOFRAN) 4 MG tablet Take 1 tablet (4 mg total) by mouth every 6 (six) hours. 10/24/21   Noemi Chapel, MD  ondansetron (ZOFRAN) 4 MG tablet Take 1 tablet (4 mg total) by mouth every 6 (six) hours as needed for nausea or vomiting. 10/25/21   Marcello Fennel, PA-C  oxyCODONE-acetaminophen (PERCOCET) 5-325 MG tablet Take 1 tablet by mouth every 4 (four) hours as needed for moderate pain. Patient not taking: Reported on 06/17/2352 03/26/43   Delora Fuel, MD  oxyCODONE-acetaminophen (PERCOCET) 5-325 MG tablet Take 1 tablet by mouth  every 4 (four) hours as needed for moderate pain. Patient not taking: Reported on 01/07/4007 03/31/60   Delora Fuel, MD  pantoprazole (PROTONIX) 40 MG tablet TAKE  ONE TABLET BY MOUTH EVERY MORNING 04/11/13   Vernie Shanks, MD      Allergies    Aspirin, Codeine, Zocor [simvastatin], and Nsaids    Review of Systems   Review of Systems  Neurological:  Positive for headaches.    Physical Exam Updated Vital Signs BP (!) 161/75   Pulse (!) 58   Temp 97.6 F (36.4 C)   Resp 18   Ht '5\' 6"'$  (1.676 m)   Wt 90.7 kg   SpO2 96%   BMI 32.27 kg/m  Physical Exam Vitals and nursing note reviewed.  Eyes:     Pupils: Pupils are equal, round, and reactive to light.  Cardiovascular:     Rate and Rhythm: Regular rhythm.  Pulmonary:     Breath sounds: No wheezing or rhonchi.  Musculoskeletal:     Cervical back: Neck supple.  Skin:    General: Skin is warm.  Neurological:     Mental Status: She is alert.     ED Results / Procedures / Treatments   Labs (all labs ordered are listed, but only abnormal results are displayed) Labs Reviewed  BASIC METABOLIC PANEL - Abnormal; Notable for the following components:      Result Value   Glucose, Bld 110 (*)    All other components within normal limits  CBC    EKG EKG Interpretation  Date/Time:  Thursday July 08 2022 20:06:28 EDT Ventricular Rate:  63 PR Interval:  200 QRS Duration: 90 QT Interval:  400 QTC Calculation: 409 R Axis:   79 Text Interpretation: Normal sinus rhythm Low voltage QRS Nonspecific ST abnormality Abnormal ECG No previous ECGs available Confirmed by Davonna Belling [phone]) on 07/08/2022 10:10:27 PM  Radiology No results found.  Procedures Procedures    Medications Ordered in ED Medications - No data to display  ED Course/ Medical Decision Making/ A&P                           Medical Decision Making Amount and/or Complexity of Data Reviewed Labs: ordered.   Patient with headache.  Improving.   Also hypertension.  Tends to get headaches when her blood pressure goes up.  No endorgan damage seen.  Feeling better.  Blood pressure is gone down somewhat.  Do not think I need imaging today.  Appears stable to discharge home with outpatient follow-up.        Final Clinical Impression(s) / ED Diagnoses Final diagnoses:  Acute nonintractable headache, unspecified headache type  Essential hypertension    Rx / DC Orders ED Discharge Orders     None         Davonna Belling, MD 07/12/22 [phone]

## 2022-11-02 ENCOUNTER — Other Ambulatory Visit: Payer: Self-pay | Admitting: Neurosurgery

## 2022-11-02 DIAGNOSIS — D329 Benign neoplasm of meninges, unspecified: Secondary | ICD-10-CM

## 2022-12-03 ENCOUNTER — Other Ambulatory Visit: Payer: Medicare HMO

## 2022-12-23 ENCOUNTER — Encounter: Payer: Self-pay | Admitting: Radiology

## 2023-01-21 ENCOUNTER — Ambulatory Visit
Admission: RE | Admit: 2023-01-21 | Discharge: 2023-01-21 | Disposition: A | Discharge status: Home / Self Care | Payer: Medicare HMO | Source: Ambulatory Visit | Attending: Neurosurgery | Admitting: Neurosurgery

## 2023-01-21 DIAGNOSIS — D329 Benign neoplasm of meninges, unspecified: Secondary | ICD-10-CM

## 2023-01-21 MED ORDER — GADOPICLENOL 0.5 MMOL/ML IV SOLN
9.0000 mL | Freq: Once | INTRAVENOUS | Status: AC | PRN
  Administered 2023-01-21: 9 mL via INTRAVENOUS

## 2023-05-18 ENCOUNTER — Other Ambulatory Visit: Payer: Self-pay | Admitting: Internal Medicine

## 2023-05-18 DIAGNOSIS — Z1231 Encounter for screening mammogram for malignant neoplasm of breast: Secondary | ICD-10-CM

## 2023-05-25 ENCOUNTER — Inpatient Hospital Stay
Admission: RE | Admit: 2023-05-25 | Discharge: 2023-05-25 | Disposition: A | Discharge status: Home / Self Care | Payer: Medicare HMO | Source: Ambulatory Visit | Attending: Internal Medicine | Admitting: Internal Medicine

## 2023-05-25 ENCOUNTER — Ambulatory Visit: Payer: Medicare HMO

## 2023-05-25 DIAGNOSIS — Z1231 Encounter for screening mammogram for malignant neoplasm of breast: Secondary | ICD-10-CM

## 2024-06-30 ENCOUNTER — Emergency Department (HOSPITAL_COMMUNITY)

## 2024-06-30 ENCOUNTER — Other Ambulatory Visit: Payer: Self-pay

## 2024-06-30 ENCOUNTER — Encounter (HOSPITAL_COMMUNITY): Payer: Self-pay | Admitting: Family Medicine

## 2024-06-30 ENCOUNTER — Observation Stay (HOSPITAL_COMMUNITY)
Admission: EM | Admit: 2024-06-30 | Discharge: 2024-07-02 | Disposition: A | Discharge status: Home / Self Care | Attending: Emergency Medicine | Admitting: Emergency Medicine

## 2024-06-30 DIAGNOSIS — Z7984 Long term (current) use of oral hypoglycemic drugs: Secondary | ICD-10-CM | POA: Insufficient documentation

## 2024-06-30 DIAGNOSIS — G459 Transient cerebral ischemic attack, unspecified: Principal | ICD-10-CM | POA: Insufficient documentation

## 2024-06-30 DIAGNOSIS — Z79899 Other long term (current) drug therapy: Secondary | ICD-10-CM | POA: Insufficient documentation

## 2024-06-30 DIAGNOSIS — K219 Gastro-esophageal reflux disease without esophagitis: Secondary | ICD-10-CM | POA: Insufficient documentation

## 2024-06-30 DIAGNOSIS — I10 Essential (primary) hypertension: Secondary | ICD-10-CM | POA: Insufficient documentation

## 2024-06-30 DIAGNOSIS — R29898 Other symptoms and signs involving the musculoskeletal system: Secondary | ICD-10-CM | POA: Insufficient documentation

## 2024-06-30 DIAGNOSIS — E119 Type 2 diabetes mellitus without complications: Secondary | ICD-10-CM | POA: Diagnosis not present

## 2024-06-30 DIAGNOSIS — E782 Mixed hyperlipidemia: Secondary | ICD-10-CM | POA: Diagnosis present

## 2024-06-30 DIAGNOSIS — E66811 Obesity, class 1: Secondary | ICD-10-CM | POA: Insufficient documentation

## 2024-06-30 DIAGNOSIS — Z6830 Body mass index (BMI) 30.0-30.9, adult: Secondary | ICD-10-CM | POA: Diagnosis not present

## 2024-06-30 DIAGNOSIS — R079 Chest pain, unspecified: Secondary | ICD-10-CM | POA: Diagnosis present

## 2024-06-30 LAB — CBC WITH DIFFERENTIAL/PLATELET
Abs Immature Granulocytes: 0.03 K/uL (ref 0.00–0.07)
Basophils Absolute: 0.1 K/uL (ref 0.0–0.1)
Basophils Relative: 1 %
Eosinophils Absolute: 1 K/uL — ABNORMAL HIGH (ref 0.0–0.5)
Eosinophils Relative: 11 %
HCT: 36.7 % (ref 36.0–46.0)
Hemoglobin: 12.1 g/dL (ref 12.0–15.0)
Immature Granulocytes: 0 %
Lymphocytes Relative: 18 %
Lymphs Abs: 1.6 K/uL (ref 0.7–4.0)
MCH: 28.8 pg (ref 26.0–34.0)
MCHC: 33 g/dL (ref 30.0–36.0)
MCV: 87.4 fL (ref 80.0–100.0)
Monocytes Absolute: 0.5 K/uL (ref 0.1–1.0)
Monocytes Relative: 6 %
Neutro Abs: 5.4 K/uL (ref 1.7–7.7)
Neutrophils Relative %: 64 %
Platelets: 262 K/uL (ref 150–400)
RBC: 4.2 MIL/uL (ref 3.87–5.11)
RDW: 14.1 % (ref 11.5–15.5)
WBC: 8.5 K/uL (ref 4.0–10.5)
nRBC: 0 % (ref 0.0–0.2)

## 2024-06-30 LAB — BASIC METABOLIC PANEL WITH GFR
Anion gap: 16 — ABNORMAL HIGH (ref 5–15)
BUN: 8 mg/dL (ref 8–23)
CO2: 24 mmol/L (ref 22–32)
Calcium: 9.6 mg/dL (ref 8.9–10.3)
Chloride: 97 mmol/L — ABNORMAL LOW (ref 98–111)
Creatinine, Ser: 0.7 mg/dL (ref 0.44–1.00)
GFR, Estimated: >60 mL/min (ref >60)
Glucose, Bld: 125 mg/dL — ABNORMAL HIGH (ref 70–99)
Potassium: 3.8 mmol/L (ref 3.5–5.1)
Sodium: 137 mmol/L (ref 135–145)

## 2024-06-30 LAB — TROPONIN I (HIGH SENSITIVITY)
Troponin I (High Sensitivity): 4 ng/L (ref <18)
Troponin I (High Sensitivity): 3 ng/L (ref <18)

## 2024-06-30 LAB — RAPID URINE DRUG SCREEN, HOSP PERFORMED
Amphetamines: NOT DETECTED
Barbiturates: NOT DETECTED
Benzodiazepines: NOT DETECTED
Cocaine: NOT DETECTED
Opiates: NOT DETECTED
Tetrahydrocannabinol: NOT DETECTED

## 2024-06-30 LAB — PROTIME-INR
INR: 1 (ref 0.8–1.2)
Prothrombin Time: 13.3 s (ref 11.4–15.2)

## 2024-06-30 LAB — APTT: aPTT: 26 s (ref 24–36)

## 2024-06-30 LAB — ETHANOL: Alcohol, Ethyl (B): <15 mg/dL (ref <15)

## 2024-06-30 MED ORDER — STROKE: EARLY STAGES OF RECOVERY BOOK: Freq: Once | Status: AC

## 2024-06-30 MED ORDER — CLOPIDOGREL BISULFATE 75 MG PO TABS
75.0000 mg | ORAL_TABLET | Freq: Once | ORAL | Status: AC
  Administered 2024-06-30: 75 mg via ORAL
  Filled 2024-06-30: qty 1

## 2024-06-30 MED ORDER — SODIUM CHLORIDE 0.9 % IV SOLN: INTRAVENOUS | Status: AC

## 2024-06-30 MED ORDER — SENNOSIDES-DOCUSATE SODIUM 8.6-50 MG PO TABS: 1.0000 | ORAL_TABLET | Freq: Every evening | ORAL | Status: DC | PRN

## 2024-06-30 MED ORDER — ACETAMINOPHEN 160 MG/5ML PO SOLN: 650.0000 mg | ORAL | Status: DC | PRN

## 2024-06-30 MED ORDER — ACETAMINOPHEN 325 MG PO TABS: 650.0000 mg | ORAL_TABLET | ORAL | Status: DC | PRN

## 2024-06-30 MED ORDER — ENOXAPARIN SODIUM 40 MG/0.4ML IJ SOSY
40.0000 mg | PREFILLED_SYRINGE | INTRAMUSCULAR | Status: DC
  Administered 2024-06-30 – 2024-07-01 (×2): 40 mg via SUBCUTANEOUS
  Filled 2024-06-30 (×2): qty 0.4

## 2024-06-30 MED ORDER — ACETAMINOPHEN 650 MG RE SUPP
650.0000 mg | RECTAL | Status: DC | PRN
Start: 2024-06-30 — End: 2024-07-02

## 2024-06-30 MED ORDER — CLOPIDOGREL BISULFATE 75 MG PO TABS
75.0000 mg | ORAL_TABLET | Freq: Every day | ORAL | Status: DC
  Administered 2024-07-01 – 2024-07-02 (×2): 75 mg via ORAL
  Filled 2024-06-30 (×2): qty 1

## 2024-06-30 NOTE — H&P (Signed)
 History and Physical    Patient: Amber Roach FMW:[id number] DOB: [birth year] DOA: 06/30/2024 DOS: the patient was seen and examined on 06/30/2024 PCP: Renato Dorothey HERO, NP  Patient coming from: Home  Chief Complaint:  Chief Complaint  Patient presents with   Chest Pain   HPI: Amber Roach is a 78 y.o. female with medical history significant of hypertension, prediabetes, arthritis.  Presents with numbness and tingling of her left arm and side.  She describes her entire arm was numb including part of her face and neck.  She went to an urgent care where an EKG was done and then she was sent here for evaluation.  Her symptoms started around noon and now have completely resolved.  In addition, she had a mild headache and some nausea; these have resolved as well.  She is not currently on blood thinners.  She has had a workup for a TIA in the past as well as serial MRIs for evaluation of a cyst.  She follows with Dr. Malcolm for neurology.  No fevers, chills, nausea, vomiting. Review of Systems: As mentioned in the history of present illness. All other systems reviewed and are negative. Past Medical History:  Diagnosis Date   Hypertension    Past Surgical History:  Procedure Laterality Date   BREAST BIOPSY     CHOLECYSTECTOMY  1995   ESOPHAGUS SURGERY     THORACIC DISC SURGERY  1981   Social History:  reports that she has never smoked. She has never used smokeless tobacco. She reports that she does not drink alcohol and does not use drugs.  Allergies  Allergen Reactions   Aspirin Swelling   Codeine Hives   Influenza Vaccines Nausea And Vomiting   Zocor [Simvastatin] Other (See Comments)    Myalgias   Nsaids Rash and Dermatitis   Penicillins Dermatitis and Other (See Comments)    Confusion    Family History  Problem Relation Age of Onset   Stroke Mother 76   Stroke Father 69   Hearing loss Brother     Prior to Admission medications   Medication Sig Start Date  End Date Taking? Authorizing Provider  cyclobenzaprine (FLEXERIL) 5 MG tablet Take 5 mg by mouth at bedtime as needed for muscle spasms. 06/12/24   [provider]  lidocaine  (LIDODERM ) 5 % Place 1 patch onto the skin daily. Only 12 hours a day. 06/08/24 06/08/25  [provider]  lisinopril -hydrochlorothiazide  (PRINZIDE ,ZESTORETIC ) 20-12.5 MG per tablet TAKE ONE TABLET BY MOUTH ONE TIME DAILY 04/11/13   Cyrena Gwenn SQUIBB, MD  losartan -hydrochlorothiazide  (HYZAAR) 100-25 MG tablet Take 1 tablet by mouth daily. 05/28/24   [provider]  metFORMIN (GLUCOPHAGE) 500 MG tablet Take 500 mg by mouth daily with breakfast. 09/07/21   [provider]  methylPREDNISolone  (MEDROL  DOSEPAK) 4 MG TBPK tablet Take 4 mg by mouth as directed. 06/08/24 06/08/25  [provider]  ondansetron  (ZOFRAN ) 4 MG tablet Take 1 tablet (4 mg total) by mouth every 6 (six) hours. 10/24/21   Cleotilde Rogue, MD  ondansetron  (ZOFRAN ) 4 MG tablet Take 1 tablet (4 mg total) by mouth every 6 (six) hours as needed for nausea or vomiting. 10/25/21   Waylan Elsie PARAS, PA-C  pantoprazole  (PROTONIX ) 40 MG tablet TAKE  ONE TABLET BY MOUTH EVERY MORNING 04/11/13   Cyrena Gwenn SQUIBB, MD    Physical Exam: Vitals:   06/30/24 1609 06/30/24 1613  BP:  (!) 168/71  Pulse:  80  Resp:  18  Temp:  97.8 F (36.6 C)  TempSrc:  Oral  SpO2:  96%  Weight: 90.7 kg   Height: 5' 6 (1.676 m)    General: Elderly female. Awake and alert and oriented x3. No acute cardiopulmonary distress.  HEENT: Normocephalic atraumatic.  Right and left ears normal in appearance.  Pupils equal, round, reactive to light. Extraocular muscles are intact. Sclerae anicteric and noninjected.  Moist mucosal membranes. No mucosal lesions.  Neck: Neck supple without lymphadenopathy. No carotid bruits. No masses palpated.  Cardiovascular: Regular rate with normal S1-S2 sounds. No murmurs, rubs, gallops auscultated. No JVD.  Respiratory: Good  respiratory effort with no wheezes, rales, rhonchi. Lungs clear to auscultation bilaterally.  No accessory muscle use. Abdomen: Soft, nontender, nondistended. Active bowel sounds. No masses or hepatosplenomegaly  Skin: No rashes, lesions, or ulcerations.  Dry, warm to touch. 2+ dorsalis pedis and radial pulses. Musculoskeletal: No calf or leg pain. All major joints not erythematous nontender.  No upper or lower joint deformation.  Good ROM.  No contractures  Psychiatric: Intact judgment and insight. Pleasant and cooperative. Neurologic: No focal neurological deficits. Strength is 5/5 and symmetric in upper and lower extremities.  Cranial nerves II through XII are grossly intact.  Data Reviewed: Labs and imaging reviewed by me  Assessment and Plan: No notes have been filed under this hospital service. Service: Hospitalist  Principal Problem:   TIA (transient ischemic attack) Active Problems:   Hypertension   GERD (gastroesophageal reflux disease)  TIA Observation on telemetry MRI/MRA head Carotid Dopplers  Echocardiogram tomorrow Hemoglobin A1c, lipid panel in the morning PT/OT/speech therapy consult Plavix  as the patient is allergic to aspirin  Hypertension Hold antihypertensives GERD   Advance Care Planning:   Code Status: Full Code confirmed by patient  Consults: Neurology  Family Communication: Husband present during interview and exam  Severity of Illness: The appropriate patient status for this patient is OBSERVATION. Observation status is judged to be reasonable and necessary in order to provide the required intensity of service to ensure the patient's safety. The patient's presenting symptoms, physical exam findings, and initial radiographic and laboratory data in the context of their medical condition is felt to place them at decreased risk for further clinical deterioration. Furthermore, it is anticipated that the patient will be medically stable for discharge from  the hospital within 2 midnights of admission.   Author: Chanteria Haggard J Georganna Maxson, DO 06/30/2024 7:49 PM  For on call review [email or web address].

## 2024-06-30 NOTE — Plan of Care (Signed)
  Problem: Clinical Measurements: Goal: Cardiovascular complication will be avoided Outcome: Progressing   Problem: Nutrition: Goal: Adequate nutrition will be maintained Outcome: Progressing   Problem: Coping: Goal: Level of anxiety will decrease Outcome: Progressing   Problem: Elimination: Goal: Will not experience complications related to urinary retention Outcome: Progressing   Problem: Pain Managment: Goal: General experience of comfort will improve and/or be controlled Outcome: Progressing   Problem: Safety: Goal: Ability to remain free from injury will improve Outcome: Progressing   Problem: Skin Integrity: Goal: Risk for impaired skin integrity will decrease Outcome: Progressing   Problem: Education: Goal: Knowledge of disease or condition will improve Outcome: Progressing   Problem: Ischemic Stroke/TIA Tissue Perfusion: Goal: Complications of ischemic stroke/TIA will be minimized Outcome: Progressing   Problem: Coping: Goal: Will verbalize positive feelings about self Outcome: Progressing Goal: Will identify appropriate support needs Outcome: Progressing   Problem: Nutrition: Goal: Dietary intake will improve Outcome: Progressing   Problem: Education: Goal: Knowledge of disease or condition will improve Outcome: Progressing

## 2024-06-30 NOTE — Plan of Care (Addendum)
 Called by ED.  Numbness in left arm approx 5 hrs ago, now resolved.NIHSS 0. Likely TIA. Recommend workup with MRI, echo and dopplers. Stat aspirin.

## 2024-06-30 NOTE — ED Triage Notes (Signed)
 Pt sent here from [facility] after having abnormal EKG and chest pain and nausea. Chest pain started around 12 today. Pt stated that she has indigestion which is also flaring up.

## 2024-06-30 NOTE — ED Provider Notes (Signed)
 [place] EMERGENCY DEPARTMENT AT [facility] Provider Note   CSN: [id number] Arrival date & time: 06/30/24  1601     Patient presents with: Chest Pain   Amber Roach is a 78 y.o. female.    Chest Pain  This patient is a 78 year old female, she has a history of hypertension and prediabetes taking her medications as prescribed, she was told that she had a pinched nerve several weeks ago when she was seen by chiropractor and was placed on methylprednisolone , those symptoms resolved.  Today at noon while she was awake she had acute onset of left arm tingling and numbness, it was the entire arm in a stocking glove distribution she also felt on the left side of her face and neck.  She went to the urgent care, they did an EKG and redirected her to the emergency department.  The patient states her symptoms have completely resolved and she has no symptoms at this time.  She does report that she had some nausea with this this morning and a mild headache but that is resolved as well.  She is not on any blood thinners.  She reports a prior workup for TIA, and review of the medical record shows that the patient had been seen and actually had MRI imaging of her brain in March 2024 which did not show anything acute or specific, she also had 1 in 2023, it was done as a follow-up of an abnormal CT scan for a cyst, there is no signs of ischemia on that one either, prior to that there was no MRI imaging of the brain going back in our record.      Prior to Admission medications   Medication Sig Start Date End Date Taking? Authorizing Provider  cyclobenzaprine (FLEXERIL) 5 MG tablet Take 5 mg by mouth at bedtime as needed for muscle spasms. 06/12/24   [provider]  lidocaine  (LIDODERM ) 5 % Place 1 patch onto the skin daily. Only 12 hours a day. 06/08/24 06/08/25  [provider]  lisinopril -hydrochlorothiazide  (PRINZIDE ,ZESTORETIC ) 20-12.5 MG per tablet TAKE ONE TABLET BY  MOUTH ONE TIME DAILY 04/11/13   Cyrena Gwenn SQUIBB, MD  losartan -hydrochlorothiazide  (HYZAAR) 100-25 MG tablet Take 1 tablet by mouth daily. 05/28/24   [provider]  metFORMIN (GLUCOPHAGE) 500 MG tablet Take 500 mg by mouth daily with breakfast. 09/07/21   [provider]  methylPREDNISolone  (MEDROL  DOSEPAK) 4 MG TBPK tablet Take 4 mg by mouth as directed. 06/08/24 06/08/25  [provider]  ondansetron  (ZOFRAN ) 4 MG tablet Take 1 tablet (4 mg total) by mouth every 6 (six) hours. 10/24/21   Cleotilde Rogue, MD  ondansetron  (ZOFRAN ) 4 MG tablet Take 1 tablet (4 mg total) by mouth every 6 (six) hours as needed for nausea or vomiting. 10/25/21   Waylan Elsie PARAS, PA-C  pantoprazole  (PROTONIX ) 40 MG tablet TAKE  ONE TABLET BY MOUTH EVERY MORNING 04/11/13   Cyrena Gwenn SQUIBB, MD    Allergies: Aspirin, Codeine, Influenza vaccines, Zocor [simvastatin], Nsaids, and Penicillins    Review of Systems  Cardiovascular:  Positive for chest pain.  All other systems reviewed and are negative.   Updated Vital Signs BP (!) 168/71   Pulse 80   Temp 97.8 F (36.6 C) (Oral)   Resp 18   Ht 1.676 m (5' 6)   Wt 90.7 kg   SpO2 96%   BMI 32.28 kg/m   Physical Exam Vitals and nursing note reviewed.  Constitutional:  General: She is not in acute distress.    Appearance: She is well-developed.  HENT:     Head: Normocephalic and atraumatic.     Mouth/Throat:     Pharynx: No oropharyngeal exudate.  Eyes:     General: No scleral icterus.       Right eye: No discharge.        Left eye: No discharge.     Conjunctiva/sclera: Conjunctivae normal.     Pupils: Pupils are equal, round, and reactive to light.  Neck:     Thyroid: No thyromegaly.     Vascular: No JVD.  Cardiovascular:     Rate and Rhythm: Normal rate and regular rhythm.     Heart sounds: Normal heart sounds. No murmur heard.    No friction rub. No gallop.  Pulmonary:     Effort: Pulmonary effort is normal. No  respiratory distress.     Breath sounds: Normal breath sounds. No wheezing or rales.  Abdominal:     General: Bowel sounds are normal. There is no distension.     Palpations: Abdomen is soft. There is no mass.     Tenderness: There is no abdominal tenderness.  Musculoskeletal:        General: No tenderness. Normal range of motion.     Cervical back: Normal range of motion and neck supple.  Lymphadenopathy:     Cervical: No cervical adenopathy.  Skin:    General: Skin is warm and dry.     Findings: No erythema or rash.  Neurological:     Mental Status: She is alert.     Coordination: Coordination normal.     Comments: Speech is clear, cranial nerves III through XII are intact, memory is intact, strength is normal in all 4 extremities including grips and strength at the bilateral thighs, knees and ankles to extention and flexion, sensation is intact to light touch and pinprick in all 4 extremities. Coordination as tested by finger-nose-finger is normal, no limb ataxia. Normal gait, normal reflexes at the patellar tendons bilaterally  Psychiatric:        Behavior: Behavior normal.     (all labs ordered are listed, but only abnormal results are displayed) Labs Reviewed  BASIC METABOLIC PANEL WITH GFR - Abnormal; Notable for the following components:      Result Value   Chloride 97 (*)    Glucose, Bld 125 (*)    Anion gap 16 (*)    All other components within normal limits  CBC WITH DIFFERENTIAL/PLATELET - Abnormal; Notable for the following components:   Eosinophils Absolute 1.0 (*)    All other components within normal limits  ETHANOL  PROTIME-INR  APTT  RAPID URINE DRUG SCREEN, HOSP PERFORMED  I-STAT CHEM 8, ED  TROPONIN I (HIGH SENSITIVITY)  TROPONIN I (HIGH SENSITIVITY)    EKG: EKG Interpretation Date/Time:  Saturday June 30 2024 16:21:53 EDT Ventricular Rate:  90 PR Interval:  220 QRS Duration:  96 QT Interval:  353 QTC Calculation: 432 R Axis:   89  Text  Interpretation: Sinus rhythm Borderline prolonged PR interval Consider left atrial enlargement Borderline right axis deviation Low voltage, precordial leads Anteroseptal infarct, old Borderline repolarization abnormality Confirmed by Cleotilde Rogue ([id number]) on 06/30/2024 4:29:19 PM  Radiology: CT HEAD WO CONTRAST Result Date: 06/30/2024 CLINICAL DATA:  Neuro deficit, acute, stroke suspected L arm numbness / tingling EXAM: CT HEAD WITHOUT CONTRAST TECHNIQUE: Contiguous axial images were obtained from the base of the skull through the vertex without  intravenous contrast. RADIATION DOSE REDUCTION: This exam was performed according to the departmental dose-optimization program which includes automated exposure control, adjustment of the mA and/or kV according to patient size and/or use of iterative reconstruction technique. COMPARISON:  10/24/2021 FINDINGS: Brain: Arachnoid cyst again noted along the anterior middle cranial fossa and in the left sylvian fissure, unchanged. No mass effect or midline shift. No effusion infarct or hemorrhage. No hydrocephalus. Vascular: No hyperdense vessel or unexpected calcification. Skull: No acute calvarial abnormality. Sinuses/Orbits: No acute findings Other: None IMPRESSION: Stable left arachnoid cyst. No acute intracranial abnormality. Electronically Signed   By: Franky Crease M.D.   On: 06/30/2024 18:17   DG Chest Port 1 View Result Date: 06/30/2024 CLINICAL DATA:  Abnormal EKG, chest pain and nausea. EXAM: PORTABLE CHEST 1 VIEW COMPARISON:  10/24/2021. FINDINGS: Trachea is midline. Heart size stable. Lungs are clear. No pleural fluid. IMPRESSION: No acute findings. Electronically Signed   By: Newell Eke M.D.   On: 06/30/2024 16:59     Procedures   Medications Ordered in the ED  clopidogrel  (PLAVIX ) tablet 75 mg (has no administration in time range)    Clinical Course as of 06/30/24 1850  Sat Jun 30, 2024  1847 Care was discussed with Dr. Nichola of the neurology  service who recommends the patient be admitted for TIA [BM]    Clinical Course User Index [BM] Cleotilde Rogue, MD                                 Medical Decision Making Amount and/or Complexity of Data Reviewed Labs: ordered. Radiology: ordered.  Risk Decision regarding hospitalization.    This patient presents to the ED for concern of acute neurologic abnormality of the left upper extremity face and neck on the left side, this distribution suggests a central cause however it is completely resolved at this point suggesting TIA and not acute stroke, this involves an extensive number of treatment options, and is a complaint that carries with it a high risk of complications and morbidity.  The differential diagnosis includes stroke, TIA, less likely to be a radicular symptom given that it was stocking glove and distribution   Co morbidities / Chronic conditions that complicate the patient evaluation  Hypertension, diabetes, obesity, elderly   Additional history obtained:  Additional history obtained from EMR External records from outside source obtained and reviewed including record including prior MRIs, see above   Lab Tests:  I Ordered, and personally interpreted labs.  The pertinent results include: Alcohol undetectable, CBC and metabolic panel also unremarkable, there is a slight anion gap but most likely due to a low chloride, troponin is normal   Imaging Studies ordered:  I ordered imaging studies including CT scan of the brain I independently visualized and interpreted imaging which showed stable appearing left arachnoid cyst and x-ray of the lungs which was normal I agree with the radiologist interpretation   Cardiac Monitoring: / EKG:  The patient was maintained on a cardiac monitor.  I personally viewed and interpreted the cardiac monitored which showed an underlying rhythm of: Normal sinus rhythm   Problem List / ED Course / Critical interventions / Medication  management  Patient with ongoing normal exam but with significant findings for possible TIA prior to arrival. I ordered medication including baby aspirin -however has allergy, first dose of Plavix  given Reevaluation of the patient after these medicines showed that the patient continues to  be well-appearing I have reviewed the patients home medicines and have made adjustments as needed   Consultations Obtained:  I requested consultation with the neurologist,  and discussed lab and imaging findings as well as pertinent plan - they recommend: Admission and TIA workup Will discuss with the hospitalist for admission   Social Determinants of Health:  Elderly   Test / Admission - Considered:  Admit to hospital      Final diagnoses:  TIA (transient ischemic attack)  Essential hypertension    ED Discharge Orders     None          Cleotilde Rogue, MD 06/30/24 1850

## 2024-06-30 NOTE — Plan of Care (Signed)
  Problem: Clinical Measurements: Goal: Cardiovascular complication will be avoided Outcome: Progressing   Problem: Nutrition: Goal: Adequate nutrition will be maintained Outcome: Progressing   Problem: Coping: Goal: Level of anxiety will decrease Outcome: Progressing   Problem: Elimination: Goal: Will not experience complications related to urinary retention Outcome: Progressing   Problem: Pain Managment: Goal: General experience of comfort will improve and/or be controlled Outcome: Progressing   Problem: Safety: Goal: Ability to remain free from injury will improve Outcome: Progressing   Problem: Skin Integrity: Goal: Risk for impaired skin integrity will decrease Outcome: Progressing

## 2024-07-01 ENCOUNTER — Observation Stay (HOSPITAL_BASED_OUTPATIENT_CLINIC_OR_DEPARTMENT_OTHER)

## 2024-07-01 ENCOUNTER — Observation Stay (HOSPITAL_COMMUNITY)

## 2024-07-01 DIAGNOSIS — G459 Transient cerebral ischemic attack, unspecified: Principal | ICD-10-CM

## 2024-07-01 LAB — URINALYSIS, ROUTINE W REFLEX MICROSCOPIC
Bilirubin Urine: NEGATIVE
Glucose, UA: NEGATIVE mg/dL
Hgb urine dipstick: NEGATIVE
Ketones, ur: NEGATIVE mg/dL
Nitrite: NEGATIVE
Protein, ur: NEGATIVE mg/dL
Specific Gravity, Urine: 1.003 — ABNORMAL LOW (ref 1.005–1.030)
pH: 6 (ref 5.0–8.0)

## 2024-07-01 LAB — ECHOCARDIOGRAM COMPLETE
Area-P 1/2: 3.85 cm2
Height: 66 in
MV M vel: 3.81 m/s
MV Peak grad: 58.1 mmHg
S' Lateral: 2.8 cm
Weight: 3379.21 [oz_av]

## 2024-07-01 LAB — LIPID PANEL
Cholesterol: 152 mg/dL (ref 0–200)
HDL: 36 mg/dL — ABNORMAL LOW (ref >40)
LDL Cholesterol: 76 mg/dL (ref 0–99)
Total CHOL/HDL Ratio: 4.2 ratio
Triglycerides: 202 mg/dL — ABNORMAL HIGH (ref <150)
VLDL: 40 mg/dL (ref 0–40)

## 2024-07-01 LAB — HEMOGLOBIN A1C
Hgb A1c MFr Bld: 6.9 % — ABNORMAL HIGH (ref 4.8–5.6)
Mean Plasma Glucose: 151.33 mg/dL

## 2024-07-01 MED ORDER — PANTOPRAZOLE SODIUM 40 MG PO TBEC
40.0000 mg | DELAYED_RELEASE_TABLET | Freq: Every day | ORAL | Status: DC
  Administered 2024-07-01 – 2024-07-02 (×2): 40 mg via ORAL
  Filled 2024-07-01 (×2): qty 1

## 2024-07-01 NOTE — Plan of Care (Signed)

## 2024-07-01 NOTE — Progress Notes (Signed)
 Progress Note   Patient: Amber Roach FMW:[id number] DOB: October 22, 1946 DOA: 06/30/2024     0 DOS: the patient was seen and examined on 07/01/2024   Brief hospital course:  78 y.o. female with medical history significant of hypertension, prediabetes, arthritis.  Presents with numbness and tingling of her left arm and side.  She describes her entire arm was numb including part of her face and neck.  She went to an urgent care where an EKG was done and then she was sent here for evaluation.  Her symptoms started around noon and now have completely resolved.  In addition, she had a mild headache and some nausea; these have resolved as well.  She is not currently on blood thinners.  She has had a workup for a TIA in the past as well as serial MRIs for evaluation of a cyst.  She follows with Dr. Malcolm for neurology.  No fevers, chills, nausea, vomiting.   Assessment and Plan: TIA Observation on telemetry MRI/MRA head-No acute infarct Carotid Dopplers-pending  Echocardiogram done pending Hemoglobin A1c-6.1, lipid panel reviewed PT/OT/speech therapy consult Plavix  as the patient is allergic to aspirin  Check UA   Hypertension Resume home BP meds GERD Continue PPI      Subjective:  Feels better than time of presentation, she denies dizziness, nausea, abdominal pain, headaches. Currently awaiting results of her stroke work up.  Physical Exam: Vitals:   06/30/24 2019 06/30/24 2300 07/01/24 0455 07/01/24 0845  BP:  (!) 168/69 (!) 147/65 (!) 176/66  Pulse:  68 64 84  Resp:  18 18   Temp:  97.8 F (36.6 C) 97.8 F (36.6 C) 97.6 F (36.4 C)  TempSrc:  Oral Oral Oral  SpO2: 95% 95% 93% 95%  Weight:      Height:      General appearance - alert, well appearing, and in no distress Chest - Diminished breath sounds bilaterally no wheezes, or rhonchi, symmetric air entry, possible  Heart - normal rate, regular rhythm, normal S1, S2, no murmurs, rubs, clicks or gallops Abdomen - soft,  nontender, nondistended, no masses or organomegaly Neurological -alert and oriented to self Extremities - No pedal edema   Data Reviewed:  Narrative & Impression  EXAM: MRI BRAIN WITHOUT CONTRAST 07/01/2024 08:17:23 AM   TECHNIQUE: Multiplanar multisequence MRI of the head/brain was performed without the administration of intravenous contrast.   COMPARISON: CT of the head dated 06/30/2024 and MRI of the head dated 01/21/2023.   CLINICAL HISTORY: 78 y.o. female with history of hypertension, prediabetes, and arthritis presents with transient left-sided numbness and tingling, mild headache, and nausea. Symptoms have resolved.   FINDINGS:   BRAIN AND VENTRICLES: No acute infarct. No intracranial hemorrhage. No mass. No midline shift. No hydrocephalus. The sella is unremarkable. Normal flow voids. 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 An arachnoid cyst again demonstrated anteriorly within the left middle cranial fossa extending into the psyllium fissure, as before.   ORBITS: No acute abnormality.   SINUSES AND MASTOIDS: No acute abnormality.   BONES AND SOFT TISSUES: Normal marrow signal. No acute soft tissue abnormality.   IMPRESSION: 1. 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 2. Arachnoid cyst anteriorly within the left middle cranial fossa extending into the psyllium fissure, as before.   Electronically signed by: Evalene Coho MD 07/01/2024 08:36 AM EDT RP Workstation: [id number]    Family Communication:   Disposition: Status is: Observation The patient remains OBS appropriate and will d/c before 2 midnights.  Planned Discharge  Destination: Home    Time spent: 35 minutes  Author: Landon FORBES Baller, MD 07/01/2024 9:53 AM  For on call review [email or web address].

## 2024-07-01 NOTE — Evaluation (Addendum)
 Physical Therapy Evaluation Patient Details Name: Amber Roach MRN: [id number] DOB: [birth year] Today's Date: 07/01/2024  History of Present Illness  Amber Roach is a 78 y.o. female with medical history significant of hypertension, prediabetes, arthritis.  Presents with numbness and tingling of her left arm and side.  She describes her entire arm was numb including part of her face and neck.  She went to an urgent care where an EKG was done and then she was sent here for evaluation.  Her symptoms started around noon and now have completely resolved.  In addition, she had a mild headache and some nausea; these have resolved as well.  She is not currently on blood thinners.  She has had a workup for a TIA in the past as well as serial MRIs for evaluation of a cyst.  She follows with Dr. Malcolm for neurology.  No fevers, chills, nausea, vomiting.    Clinical Impression  On therapist arrival; patient is sitting up on the edge of the bed; her husband is present at bedside.  Patient reports no pain or any of her prior numbness and tingling symptoms.  Patient is agreeable to therapist assessment.  She performs sit to stand independently and is able to walk x 150 ft without AD independently.  Patient does not have further skilled therapy needs at this time and is discharged to the care of nursing.          If plan is discharge home, recommend the following: Help with stairs or ramp for entrance   Can travel by private vehicle        Equipment Recommendations None recommended by PT  Recommendations for Other Services       Functional Status Assessment Patient has had a recent decline in their functional status and demonstrates the ability to make significant improvements in function in a reasonable and predictable amount of time.     Precautions / Restrictions Precautions Precautions: None Recall of Precautions/Restrictions: Intact Restrictions Weight Bearing Restrictions Per  Provider Order: No      Mobility  Bed Mobility Overal bed mobility: Independent                  Transfers Overall transfer level: Independent                      Ambulation/Gait Ambulation/Gait assistance: Independent Gait Distance (Feet): 150 Feet   Gait Pattern/deviations: WFL(Within Functional Limits)          Stairs            Wheelchair Mobility     Tilt Bed    Modified Rankin (Stroke Patients Only)       Balance Overall balance assessment: Independent                                           Pertinent Vitals/Pain Pain Assessment Pain Assessment: No/denies pain    Home Living Family/patient expects to be discharged to:: Private residence Living Arrangements: Spouse/significant other Available Help at Discharge: Family;Available 24 hours/day Type of Home: House Home Access: Stairs to enter Entrance Stairs-Rails: Right Entrance Stairs-Number of Steps: 3 Alternate Level Stairs-Number of Steps: 12 steps up to bedroom Home Layout: Two level Home Equipment: None      Prior Function Prior Level of Function : Independent/Modified Independent  Mobility Comments: walks without AD; drives       Extremity/Trunk Assessment   Upper Extremity Assessment Upper Extremity Assessment: Defer to OT evaluation    Lower Extremity Assessment Lower Extremity Assessment: Overall WFL for tasks assessed    Cervical / Trunk Assessment Cervical / Trunk Assessment: Normal  Communication   Communication Communication: No apparent difficulties    Cognition Arousal: Alert Behavior During Therapy: WFL for tasks assessed/performed   PT - Cognitive impairments: No apparent impairments                         Following commands: Intact       Cueing       General Comments      Exercises     Assessment/Plan    PT Assessment Patient does not need any further PT services  PT Problem  List         PT Treatment Interventions      PT Goals (Current goals can be found in the Care Plan section)  Acute Rehab PT Goals Patient Stated Goal: return home PT Goal Formulation: With patient/family Time For Goal Achievement: 07/15/24 Potential to Achieve Goals: Good    Frequency       Co-evaluation               AM-PAC PT 6 Clicks Mobility  Outcome Measure Help needed turning from your back to your side while in a flat bed without using bedrails?: None Help needed moving from lying on your back to sitting on the side of a flat bed without using bedrails?: None Help needed moving to and from a bed to a chair (including a wheelchair)?: None Help needed standing up from a chair using your arms (e.g., wheelchair or bedside chair)?: None Help needed to walk in hospital room?: None Help needed climbing 3-5 steps with a railing? : A Little 6 Click Score: 23    End of Session   Activity Tolerance: Patient tolerated treatment well Patient left: in chair;with family/visitor present Nurse Communication: Mobility status PT Visit Diagnosis: Other symptoms and signs involving the nervous system (R29.898)    Time: 8679-8664 PT Time Calculation (min) (ACUTE ONLY): 15 min   Charges:   PT Evaluation $PT Eval Moderate Complexity: 1 Mod   PT General Charges $$ ACUTE PT VISIT: 1 Visit         3:29 PM, 07/01/24 Derelle Cockrell Small Allisen Pidgeon MPT [facility] [place] [phone] Ph:[phone]

## 2024-07-01 NOTE — Progress Notes (Signed)
  Echocardiogram 2D Echocardiogram has been performed.  Koleen KANDICE Popper, RDCS 07/01/2024, 11:21 AM

## 2024-07-01 NOTE — Care Management Obs Status (Signed)
 MEDICARE OBSERVATION STATUS NOTIFICATION   Patient Details  Name: Modelle Vollmer MRN: [id number] Date of Birth: August 17, 1946   Medicare Observation Status Notification Given:  Yes    Nena LITTIE Coffee, RN 07/01/2024, 11:31 AM

## 2024-07-02 DIAGNOSIS — E66811 Obesity, class 1: Secondary | ICD-10-CM | POA: Diagnosis not present

## 2024-07-02 DIAGNOSIS — I10 Essential (primary) hypertension: Secondary | ICD-10-CM

## 2024-07-02 DIAGNOSIS — E782 Mixed hyperlipidemia: Secondary | ICD-10-CM | POA: Diagnosis not present

## 2024-07-02 DIAGNOSIS — E1165 Type 2 diabetes mellitus with hyperglycemia: Secondary | ICD-10-CM

## 2024-07-02 DIAGNOSIS — G459 Transient cerebral ischemic attack, unspecified: Secondary | ICD-10-CM | POA: Diagnosis not present

## 2024-07-02 MED ORDER — CLOPIDOGREL BISULFATE 75 MG PO TABS: 75.0000 mg | ORAL_TABLET | Freq: Every day | ORAL | 1 refills | Status: AC

## 2024-07-02 MED ORDER — LOSARTAN POTASSIUM-HCTZ 100-25 MG PO TABS: 1.0000 | ORAL_TABLET | Freq: Every day | ORAL | Status: DC

## 2024-07-02 MED ORDER — HYDROCHLOROTHIAZIDE 25 MG PO TABS
25.0000 mg | ORAL_TABLET | Freq: Every day | ORAL | Status: DC
  Administered 2024-07-02: 25 mg via ORAL
  Filled 2024-07-02: qty 1

## 2024-07-02 MED ORDER — LOSARTAN POTASSIUM 50 MG PO TABS
100.0000 mg | ORAL_TABLET | Freq: Every day | ORAL | Status: DC
  Administered 2024-07-02: 100 mg via ORAL
  Filled 2024-07-02: qty 2

## 2024-07-02 NOTE — Discharge Summary (Signed)
 Physician Discharge Summary   Patient: Amber Roach MRN: [id number] DOB: 12-Dec-1945  Admit date:     06/30/2024  Discharge date: 07/02/24  Discharge Physician: Concepcion Riser   PCP: Renato Dorothey HERO, NP   Recommendations at discharge:   PCP follow up in 1 week. Keep a log of blood sugars, blood pressure for PCP to adjust meds. Outpatient neurology follow up suggested.  Discharge Diagnoses: Principal Problem:   TIA (transient ischemic attack) Active Problems:   Essential hypertension   Mixed dyslipidemia   GERD (gastroesophageal reflux disease)   Obesity (BMI 30.0-34.9)  Resolved Problems:   * No resolved hospital problems. *  Hospital Course: 78 y.o. female with medical history significant of hypertension, prediabetes, arthritis.  Presents with numbness and tingling of her left arm and side.  Patient's symptoms completely resolved within a few hours.  Patient is admitted to hospitalist service for TIA workup.  Assessment and Plan: TIA- MRI/MRA head no infarct. Carotid Dopplers showed 50% stenosis, antegrade flow. A1c 6.9, lipid panel with LDL 76. Echocardiogram done showed normal EF, no shunts. Neurologist evaluated patient recommended Plavix  therapy as she is allergic to aspirin. Discussed about her risk factors for stroke and managing them as outpatient. PT/ OT did not recommend outpatient therapy.  Hypertension- Initially placed on permissive hypertension protocol. Resumed home losartan  hydrochlorothiazide  regimen. Advised to keep a log of blood pressures PCP to adjust antihypertensive regimen.  Type 2 diabetes mellitus- A1c 6.9. Discussed about blood sugar control. Understands to keep a log of blood sugars. Resume metformin therapy.  Hypertriglyceridemia- States she is intolerant to statin, tried fenofibrate  too. Advised to follow-up with neurology for further management.  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 Advised importance of weight loss. Diet  education provided.        Consultants: none Procedures performed: none  Disposition: Home Diet recommendation:  Discharge Diet Orders (From admission, onward)     Start     Ordered   07/02/24 0000  Diet - low sodium heart healthy        07/02/24 1057   07/02/24 0000  Diet Carb Modified        07/02/24 1057           Cardiac and Carb modified diet DISCHARGE MEDICATION: Allergies as of 07/02/2024       Reactions   Aspirin Swelling   Codeine Hives   Influenza Vaccines Nausea And Vomiting   Zocor [simvastatin] Other (See Comments)   Myalgias   Nsaids Rash, Dermatitis   Penicillins Dermatitis, Other (See Comments)   Confusion        Medication List     STOP taking these medications    methylPREDNISolone  4 MG Tbpk tablet Commonly known as: MEDROL  DOSEPAK       TAKE these medications    acetaminophen  500 MG tablet Commonly known as: TYLENOL  Take 500 mg by mouth every 6 (six) hours as needed for moderate pain (pain score 4-6).   clopidogrel  75 MG tablet Commonly known as: PLAVIX  Take 1 tablet (75 mg total) by mouth daily. Start taking on: July 03, 2024   cyclobenzaprine 5 MG tablet Commonly known as: FLEXERIL Take 5 mg by mouth at bedtime as needed for muscle spasms.   lidocaine  5 % Commonly known as: LIDODERM  Place 1 patch onto the skin daily. Only 12 hours a day.   losartan -hydrochlorothiazide  100-25 MG tablet Commonly known as: HYZAAR Take 1 tablet by mouth daily.   metFORMIN 500 MG tablet Commonly known as: GLUCOPHAGE  Take 500 mg by mouth daily with breakfast.   pantoprazole  40 MG tablet Commonly known as: PROTONIX  TAKE  ONE TABLET BY MOUTH EVERY MORNING What changed:  how much to take how to take this when to take this        Follow-up Information     Renato Dorothey HERO, NP. Schedule an appointment as soon as possible for a visit on 07/10/2024.   Specialty: Internal Medicine Why: [email or web address] 16th at 10am Contact  information: [address] [place] [address] [phone]                Discharge Exam: Fredricka Weights   06/30/24 1609 06/30/24 2018  Weight: 90.7 kg 95.8 kg   General - Elderly obese Caucasian female, no apparent distress HEENT - PERRLA, EOMI, atraumatic head, non tender sinuses. Lung - distant breath sounds, no rales, rhonchi, wheezes. Heart - S1, S2 heard, no murmurs, rubs, no pedal edema. Abdomen - Soft, non tender, obese, bowel sounds good. Neuro - Alert, awake and oriented x 3, non focal exam. Skin - Warm and dry.  Condition at discharge: stable  The results of significant diagnostics from this hospitalization (including imaging, microbiology, ancillary and laboratory) are listed below for reference.   Imaging Studies: US  Carotid Bilateral (at [facility] and [facility] only) Result Date: 07/01/2024 CLINICAL DATA:  78 year old female with history of transient ischemic attack. EXAM: BILATERAL CAROTID DUPLEX ULTRASOUND TECHNIQUE: Elnor scale imaging, color Doppler and duplex ultrasound were performed of bilateral carotid and vertebral arteries in the neck. COMPARISON:  None Available. FINDINGS: Criteria: Quantification of carotid stenosis is based on velocity parameters that correlate the residual internal carotid diameter with NASCET-based stenosis levels, using the diameter of the distal internal carotid lumen as the denominator for stenosis measurement. The following velocity measurements were obtained: RIGHT ICA: Peak systolic velocity 87 cm/sec, End diastolic velocity 17 cm/sec CCA: Peak systolic velocity 98 cm/sec SYSTOLIC ICA/CCA RATIO:  0.9 ECA: Peak systolic velocity 95 cm/sec LEFT ICA: Peak systolic velocity 112 cm/sec, End diastolic velocity 18 cm/sec CCA: 89 cm/sec SYSTOLIC ICA/CCA RATIO:  1.3 ECA: 109 cm/sec RIGHT CAROTID ARTERY: Mild multifocal atherosclerotic plaque formation. No significant tortuosity. Normal low resistance waveforms. RIGHT VERTEBRAL ARTERY:  Antegrade flow.  LEFT CAROTID ARTERY: Mild multifocal atherosclerotic plaque formation. No significant tortuosity. Normal low resistance waveforms. LEFT VERTEBRAL ARTERY:  Antegrade flow. Upper extremity non-invasive blood pressures: Not obtained. IMPRESSION: 1. Right carotid artery system: Less than 50% stenosis secondary to mild multifocal atherosclerotic plaque formation. 2. Left carotid artery system: Less than 50% stenosis secondary to mild multifocal atherosclerotic plaque formation. 3.  Vertebral artery system: Patent with antegrade flow bilaterally. Ester Sides, MD [facility] Electronically Signed   By: Ester Sides M.D.   On: 07/01/2024 14:41   ECHOCARDIOGRAM COMPLETE Result Date: 07/01/2024    ECHOCARDIOGRAM REPORT   Patient Name:   JAMYE BALICKI [facility] Date of Exam: 07/01/2024 Medical Rec #:  [id number]             Height:       66.0 in Accession #:    [id number]            Weight:       211.2 lb Date of Birth:  1946-05-04             BSA:          2.047 m Patient Age:    80 years  BP:           147/65 mmHg Patient Gender: F                     HR:           78 bpm. Exam Location:  [facility] Procedure: 2D Echo, Cardiac Doppler and Color Doppler (Both Spectral and Color            Flow Doppler were utilized during procedure). Indications:    TIA G45.9  History:        Patient has no prior history of Echocardiogram examinations.                 TIA; Risk Factors:Hypertension and Dyslipidemia.  Sonographer:    Koleen Popper RDCS Referring Phys: [phone] JACOB J STINSON  Sonographer Comments: Patient is obese. IMPRESSIONS  1. Left ventricular ejection fraction, by estimation, is 60 to 65%. The left ventricle has normal function. Left ventricular endocardial border not optimally defined to evaluate regional wall motion. There is mild left ventricular hypertrophy. Left ventricular diastolic parameters were normal.  2. Right ventricular systolic function was  not well visualized. The right ventricular size is normal. There is normal pulmonary artery systolic pressure.  3. The mitral valve is abnormal. Trivial mitral valve regurgitation. No evidence of mitral stenosis. Moderate mitral annular calcification.  4. The aortic valve is tricuspid. Aortic valve regurgitation is trivial. No aortic stenosis is present.  5. The inferior vena cava is normal in size with greater than 50% respiratory variability, suggesting right atrial pressure of 3 mmHg. Comparison(s): No prior Echocardiogram. FINDINGS  Left Ventricle: Left ventricular ejection fraction, by estimation, is 60 to 65%. The left ventricle has normal function. Left ventricular endocardial border not optimally defined to evaluate regional wall motion. Strain was performed and the global longitudinal strain is indeterminate. The left ventricular internal cavity size was normal in size. There is mild left ventricular hypertrophy. Left ventricular diastolic parameters were normal. Right Ventricle: The right ventricular size is normal. No increase in right ventricular wall thickness. Right ventricular systolic function was not well visualized. There is normal pulmonary artery systolic pressure. The tricuspid regurgitant velocity is  2.54 m/s, and with an assumed right atrial pressure of 3 mmHg, the estimated right ventricular systolic pressure is 28.8 mmHg. Left Atrium: Left atrial size was normal in size. Right Atrium: Right atrial size was normal in size. Pericardium: There is no evidence of pericardial effusion. Mitral Valve: The mitral valve is abnormal. Moderate mitral annular calcification. Trivial mitral valve regurgitation. No evidence of mitral valve stenosis. Tricuspid Valve: The tricuspid valve is normal in structure. Tricuspid valve regurgitation is trivial. No evidence of tricuspid stenosis. Aortic Valve: The aortic valve is tricuspid. Aortic valve regurgitation is trivial. No aortic stenosis is present. Pulmonic  Valve: The pulmonic valve was normal in structure. Pulmonic valve regurgitation is trivial. No evidence of pulmonic stenosis. Aorta: The aortic root is normal in size and structure. Venous: The inferior vena cava is normal in size with greater than 50% respiratory variability, suggesting right atrial pressure of 3 mmHg. IAS/Shunts: No atrial level shunt detected by color flow Doppler. Additional Comments: 3D was performed not requiring image post processing on an independent workstation and was indeterminate.  LEFT VENTRICLE PLAX 2D LVIDd:         4.40 cm   Diastology LVIDs:         2.80 cm   LV e' medial:    8.92  cm/s LV PW:         1.00 cm   LV E/e' medial:  12.7 LV IVS:        1.20 cm   LV e' lateral:   8.27 cm/s LVOT diam:     1.90 cm   LV E/e' lateral: 13.7 LV SV:         73 LV SV Index:   36 LVOT Area:     2.84 cm  RIGHT VENTRICLE             IVC RV S prime:     10.90 cm/s  IVC diam: 1.60 cm TAPSE (M-mode): 2.2 cm LEFT ATRIUM             Index        RIGHT ATRIUM           Index LA diam:        4.10 cm 2.00 cm/m   RA Area:     11.60 cm LA Vol (A2C):   37.4 ml 18.27 ml/m  RA Volume:   27.80 ml  13.58 ml/m LA Vol (A4C):   26.0 ml 12.70 ml/m LA Biplane Vol: 32.6 ml 15.93 ml/m  AORTIC VALVE LVOT Vmax:   117.00 cm/s LVOT Vmean:  84.500 cm/s LVOT VTI:    0.259 m  AORTA Ao Root diam: 2.70 cm Ao Asc diam:  3.00 cm MITRAL VALVE                TRICUSPID VALVE MV Area (PHT): 3.85 cm     TR Peak grad:   25.8 mmHg MV Decel Time: 197 msec     TR Vmax:        254.00 cm/s MR Peak grad: 58.1 mmHg MR Vmax:      381.00 cm/s   SHUNTS MV E velocity: 113.00 cm/s  Systemic VTI:  0.26 m MV A velocity: 88.80 cm/s   Systemic Diam: 1.90 cm MV E/A ratio:  1.27 Vishnu Priya Mallipeddi Electronically signed by Diannah Late Mallipeddi Signature Date/Time: 07/01/2024/2:04:54 PM    Final    MR BRAIN WO CONTRAST Result Date: 07/01/2024 EXAM: MRI BRAIN WITHOUT CONTRAST 07/01/2024 08:17:23 AM TECHNIQUE: Multiplanar multisequence MRI of  the head/brain was performed without the administration of intravenous contrast. COMPARISON: CT of the head dated 06/30/2024 and MRI of the head dated 01/21/2023. CLINICAL HISTORY: 78 y.o. female with history of hypertension, prediabetes, and arthritis presents with transient left-sided numbness and tingling, mild headache, and nausea. Symptoms have resolved. FINDINGS: BRAIN AND VENTRICLES: No acute infarct. No intracranial hemorrhage. No mass. No midline shift. No hydrocephalus. The sella is unremarkable. Normal flow voids. 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 An arachnoid cyst again demonstrated anteriorly within the left middle cranial fossa extending into the psyllium fissure, as before. ORBITS: No acute abnormality. SINUSES AND MASTOIDS: No acute abnormality. BONES AND SOFT TISSUES: Normal marrow signal. No acute soft tissue abnormality. IMPRESSION: 1. 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 2. Arachnoid cyst anteriorly within the left middle cranial fossa extending into the psyllium fissure, as before. Electronically signed by: Evalene Coho MD 07/01/2024 08:36 AM EDT RP Workstation: [id number]   CT HEAD WO CONTRAST Result Date: 06/30/2024 CLINICAL DATA:  Neuro deficit, acute, stroke suspected L arm numbness / tingling EXAM: CT HEAD WITHOUT CONTRAST TECHNIQUE: Contiguous axial images were obtained from the base of the skull through the vertex without intravenous contrast. RADIATION DOSE REDUCTION: This exam was performed according to the departmental dose-optimization program which includes automated  exposure control, adjustment of the mA and/or kV according to patient size and/or use of iterative reconstruction technique. COMPARISON:  10/24/2021 FINDINGS: Brain: Arachnoid cyst again noted along the anterior middle cranial fossa and in the left sylvian fissure, unchanged. No mass effect or midline shift. No effusion infarct or hemorrhage. No hydrocephalus. Vascular:  No hyperdense vessel or unexpected calcification. Skull: No acute calvarial abnormality. Sinuses/Orbits: No acute findings Other: None IMPRESSION: Stable left arachnoid cyst. No acute intracranial abnormality. Electronically Signed   By: Franky Crease M.D.   On: 06/30/2024 18:17   DG Chest Port 1 View Result Date: 06/30/2024 CLINICAL DATA:  Abnormal EKG, chest pain and nausea. EXAM: PORTABLE CHEST 1 VIEW COMPARISON:  10/24/2021. FINDINGS: Trachea is midline. Heart size stable. Lungs are clear. No pleural fluid. IMPRESSION: No acute findings. Electronically Signed   By: Newell Eke M.D.   On: 06/30/2024 16:59    Microbiology: Results for orders placed or performed during the hospital encounter of 10/24/21  Urine Culture     Status: None   Collection Time: 10/24/21  5:56 PM   Specimen: Urine, Clean Catch  Result Value Ref Range Status   Specimen Description   Final    URINE, CLEAN CATCH Performed at [facility], [address] [place], [place] [address]    Special Requests   Final    NONE Performed at [facility], [address], [place], [place] [address]    Culture   Final    NO GROWTH Performed at [facility], [address] [place], [place] [address]    Report Status 10/26/2021 FINAL  Final  Resp Panel by RT-PCR (Flu A&B, Covid) Nasopharyngeal Swab     Status: None   Collection Time: 10/24/21  8:06 PM   Specimen: Nasopharyngeal Swab; Nasopharyngeal(NP) swabs in vial transport medium  Result Value Ref Range Status   SARS Coronavirus 2 by RT PCR NEGATIVE NEGATIVE Final    Comment: (NOTE) SARS-CoV-2 target nucleic acids are NOT DETECTED.  The SARS-CoV-2 RNA is generally detectable in upper respiratory specimens during the acute phase of infection. The lowest concentration of SARS-CoV-2 viral copies this assay can detect is 138 copies/mL. A negative result does not preclude SARS-Cov-2 infection and should not be used as the sole basis for treatment or other patient  management decisions. A negative result may occur with  improper specimen collection/handling, submission of specimen other than nasopharyngeal swab, presence of viral mutation(s) within the areas targeted by this assay, and inadequate number of viral copies(<138 copies/mL). A negative result must be combined with clinical observations, patient history, and epidemiological information. The expected result is Negative.  Fact Sheet for Patients:  BloggerCourse.com  Fact Sheet for Healthcare Providers:  SeriousBroker.it  This test is no t yet approved or cleared by the [place]  FDA and  has been authorized for detection and/or diagnosis of SARS-CoV-2 by FDA under an Emergency Use Authorization (EUA). This EUA will remain  in effect (meaning this test can be used) for the duration of the COVID-19 declaration under Section 564(b)(1) of the Act, 21 U.S.C.section 360bbb-3(b)(1), unless the authorization is terminated  or revoked sooner.       Influenza A by PCR NEGATIVE NEGATIVE Final   Influenza B by PCR NEGATIVE NEGATIVE Final    Comment: (NOTE) The Xpert Xpress SARS-CoV-2/FLU/RSV plus assay is intended as an aid in the diagnosis of influenza from Nasopharyngeal swab specimens and should not be used as a sole basis for treatment. Nasal washings  and aspirates are unacceptable for Xpert Xpress SARS-CoV-2/FLU/RSV testing.  Fact Sheet for Patients: BloggerCourse.com  Fact Sheet for Healthcare Providers: SeriousBroker.it  This test is not yet approved or cleared by the [place]  FDA and has been authorized for detection and/or diagnosis of SARS-CoV-2 by FDA under an Emergency Use Authorization (EUA). This EUA will remain in effect (meaning this test can be used) for the duration of the COVID-19 declaration under Section 564(b)(1) of the Act, 21 U.S.C. section 360bbb-3(b)(1),  unless the authorization is terminated or revoked.  Performed at [facility], [address] [place], [place] [address]     Labs: CBC: Recent Labs  Lab 06/30/24 1640  WBC 8.5  NEUTROABS 5.4  HGB 12.1  HCT 36.7  MCV 87.4  PLT 262   Basic Metabolic Panel: Recent Labs  Lab 06/30/24 1639  NA 137  K 3.8  CL 97*  CO2 24  GLUCOSE 125*  BUN 8  CREATININE 0.70  CALCIUM 9.6   Liver Function Tests: No results for input(s): AST, ALT, ALKPHOS, BILITOT, PROT, ALBUMIN in the last 168 hours. CBG: No results for input(s): GLUCAP in the last 168 hours.  Discharge time spent: 35 minutes.  Signed: Concepcion Riser, MD [facility] 07/02/2024

## 2024-07-02 NOTE — Plan of Care (Signed)
  Problem: Education: Goal: Knowledge of General Education information will improve Description: Including pain rating scale, medication(s)/side effects and non-pharmacologic comfort measures Outcome: Progressing   Problem: Health Behavior/Discharge Planning: Goal: Ability to manage health-related needs will improve Outcome: Progressing   Problem: Clinical Measurements: Goal: Ability to maintain clinical measurements within normal limits will improve Outcome: Progressing Goal: Will remain free from infection Outcome: Progressing Goal: Diagnostic test results will improve Outcome: Progressing Goal: Respiratory complications will improve Outcome: Progressing Goal: Cardiovascular complication will be avoided Outcome: Progressing   Problem: Activity: Goal: Risk for activity intolerance will decrease Outcome: Progressing   Problem: Nutrition: Goal: Adequate nutrition will be maintained Outcome: Progressing   Problem: Coping: Goal: Level of anxiety will decrease Outcome: Progressing   Problem: Elimination: Goal: Will not experience complications related to bowel motility Outcome: Progressing Goal: Will not experience complications related to urinary retention Outcome: Progressing   Problem: Pain Managment: Goal: General experience of comfort will improve and/or be controlled Outcome: Progressing   Problem: Safety: Goal: Ability to remain free from injury will improve Outcome: Progressing   Problem: Skin Integrity: Goal: Risk for impaired skin integrity will decrease Outcome: Progressing   Problem: Education: Goal: Knowledge of disease or condition will improve Outcome: Progressing Goal: Knowledge of secondary prevention will improve (MUST DOCUMENT ALL) Outcome: Progressing Goal: Knowledge of patient specific risk factors will improve (DELETE if not current risk factor) Outcome: Progressing   Problem: Ischemic Stroke/TIA Tissue Perfusion: Goal: Complications of  ischemic stroke/TIA will be minimized Outcome: Progressing   Problem: Coping: Goal: Will verbalize positive feelings about self Outcome: Progressing Goal: Will identify appropriate support needs Outcome: Progressing   Problem: Health Behavior/Discharge Planning: Goal: Ability to manage health-related needs will improve Outcome: Progressing Goal: Goals will be collaboratively established with patient/family Outcome: Progressing   Problem: Self-Care: Goal: Ability to participate in self-care as condition permits will improve Outcome: Progressing Goal: Verbalization of feelings and concerns over difficulty with self-care will improve Outcome: Progressing Goal: Ability to communicate needs accurately will improve Outcome: Progressing   Problem: Nutrition: Goal: Risk of aspiration will decrease Outcome: Progressing Goal: Dietary intake will improve Outcome: Progressing   Problem: Education: Goal: Knowledge of disease or condition will improve Outcome: Progressing Goal: Knowledge of secondary prevention will improve (MUST DOCUMENT ALL) Outcome: Progressing Goal: Knowledge of patient specific risk factors will improve (DELETE if not current risk factor) Outcome: Progressing   Problem: Ischemic Stroke/TIA Tissue Perfusion: Goal: Complications of ischemic stroke/TIA will be minimized Outcome: Progressing   Problem: Coping: Goal: Will verbalize positive feelings about self Outcome: Progressing Goal: Will identify appropriate support needs Outcome: Progressing   Problem: Health Behavior/Discharge Planning: Goal: Ability to manage health-related needs will improve Outcome: Progressing Goal: Goals will be collaboratively established with patient/family Outcome: Progressing   Problem: Self-Care: Goal: Ability to participate in self-care as condition permits will improve Outcome: Progressing Goal: Verbalization of feelings and concerns over difficulty with self-care will  improve Outcome: Progressing Goal: Ability to communicate needs accurately will improve Outcome: Progressing   Problem: Nutrition: Goal: Risk of aspiration will decrease Outcome: Progressing Goal: Dietary intake will improve Outcome: Progressing

## 2024-07-02 NOTE — Evaluation (Signed)
 Occupational Therapy Evaluation Patient Details Name: Amber Roach MRN: [id number] DOB: [birth year] Today's Date: 07/02/2024   History of Present Illness   Amber Roach is a 78 y.o. female with medical history significant of hypertension, prediabetes, arthritis.  Presents with numbness and tingling of her left arm and side.  She describes her entire arm was numb including part of her face and neck.  She went to an urgent care where an EKG was done and then she was sent here for evaluation.  Her symptoms started around noon and now have completely resolved.  In addition, she had a mild headache and some nausea; these have resolved as well.  She is not currently on blood thinners.  She has had a workup for a TIA in the past as well as serial MRIs for evaluation of a cyst.  She follows with Dr. Malcolm for neurology.  No fevers, chills, nausea, vomiting. (per MD)     Clinical Impressions Pt appears to be at or near baseline function. No deficits noted today. WFL B UE strength and coordination. Pt able to ambulate to the bathroom independently. Pt is not recommended for further acute OT services and will be discharged to care of nursing staff for remaining length of stay.       Functional Status Assessment   Patient has not had a recent decline in their functional status     Equipment Recommendations   None recommended by OT             Precautions/Restrictions   Restrictions Weight Bearing Restrictions Per Provider Order: No     Mobility Bed Mobility Overal bed mobility: Independent                  Transfers Overall transfer level: Independent                        Balance Overall balance assessment: Independent                                         ADL either performed or assessed with clinical judgement   ADL Overall ADL's : Independent                                             Vision  Baseline Vision/History: 1 Wears glasses Ability to See in Adequate Light: 0 Adequate Patient Visual Report: No change from baseline Vision Assessment?: No apparent visual deficits     Perception Perception: Not tested       Praxis Praxis: Not tested       Pertinent Vitals/Pain Pain Assessment Pain Assessment: No/denies pain     Extremity/Trunk Assessment Upper Extremity Assessment Upper Extremity Assessment: Overall WFL for tasks assessed   Lower Extremity Assessment Lower Extremity Assessment: Defer to PT evaluation   Cervical / Trunk Assessment Cervical / Trunk Assessment: Normal   Communication Communication Communication: No apparent difficulties   Cognition Arousal: Alert Behavior During Therapy: WFL for tasks assessed/performed Cognition: No apparent impairments                               Following commands: Intact       Cueing  General Comments  Cueing Techniques: Verbal cues                 Home Living Family/patient expects to be discharged to:: Private residence Living Arrangements: Spouse/significant other Available Help at Discharge: Family;Available 24 hours/day Type of Home: House Home Access: Stairs to enter [employer] of Steps: 3 Entrance Stairs-Rails: Right Home Layout: Two level Alternate Level Stairs-Number of Steps: 12 steps up to bedroom Alternate Level Stairs-Rails: Left;Right;Can reach both Bathroom Shower/Tub: Tub/shower unit;Walk-in shower   Bathroom Toilet: Standard Bathroom Accessibility: Yes   Home Equipment: None          Prior Functioning/Environment Prior Level of Function : Independent/Modified Independent             Mobility Comments: walks without AD; drives ADLs Comments: Independent     AM-PAC OT 6 Clicks Daily Activity     Outcome Measure Help from another person eating meals?: None Help from another person taking care of personal grooming?: None Help from  another person toileting, which includes using toliet, bedpan, or urinal?: None Help from another person bathing (including washing, rinsing, drying)?: None Help from another person to put on and taking off regular upper body clothing?: None Help from another person to put on and taking off regular lower body clothing?: None 6 Click Score: 24   End of Session    Activity Tolerance: Patient tolerated treatment well Patient left: in bed;with call bell/phone within reach  OT Visit Diagnosis: Other symptoms and signs involving the nervous system (M70.101)                Time: 9078-9068 OT Time Calculation (min): 10 min Charges:  OT General Charges $OT Visit: 1 Visit OT Evaluation $OT Eval Low Complexity: 1 Low  Roniya Tetro OT, MOT  Jayson Person 07/02/2024, 11:16 AM

## 2024-07-30 ENCOUNTER — Other Ambulatory Visit: Payer: Self-pay | Admitting: Internal Medicine

## 2024-07-30 DIAGNOSIS — Z1231 Encounter for screening mammogram for malignant neoplasm of breast: Secondary | ICD-10-CM

## 2024-08-13 ENCOUNTER — Ambulatory Visit
Admission: RE | Admit: 2024-08-13 | Discharge: 2024-08-13 | Disposition: A | Discharge status: Home / Self Care | Source: Ambulatory Visit | Attending: Internal Medicine | Admitting: Internal Medicine

## 2024-08-13 DIAGNOSIS — Z1231 Encounter for screening mammogram for malignant neoplasm of breast: Secondary | ICD-10-CM
